# Patient Record
Sex: Female | Born: 1985
Health system: Southern US, Community
[De-identification: ages and names within clinical notes are randomized; demographics above are authoritative.]

## PROBLEM LIST (undated history)

## (undated) DIAGNOSIS — N809 Endometriosis, unspecified: Secondary | ICD-10-CM

## (undated) DIAGNOSIS — E079 Disorder of thyroid, unspecified: Secondary | ICD-10-CM

## (undated) HISTORY — PX: TONSILLECTOMY: SUR1361

## (undated) SURGERY — LAPAROSCOPY, DIAGNOSTIC
Anesthesia: General

---
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
## 2006-01-24 ENCOUNTER — Ambulatory Visit: Payer: Self-pay | Admitting: Nurse Practitioner

## 2006-07-30 ENCOUNTER — Emergency Department: Payer: Self-pay | Admitting: Emergency Medicine

## 2007-12-18 ENCOUNTER — Ambulatory Visit: Payer: Self-pay | Admitting: Orthopedic Surgery

## 2007-12-25 ENCOUNTER — Ambulatory Visit: Payer: Self-pay | Admitting: Orthopedic Surgery

## 2008-12-11 ENCOUNTER — Ambulatory Visit: Payer: Self-pay | Admitting: Family Medicine

## 2008-12-12 ENCOUNTER — Ambulatory Visit: Payer: Self-pay | Admitting: Family Medicine

## 2011-01-28 ENCOUNTER — Ambulatory Visit: Payer: Self-pay | Admitting: Family Medicine

## 2011-02-22 ENCOUNTER — Ambulatory Visit: Payer: Self-pay | Admitting: Obstetrics and Gynecology

## 2011-02-28 ENCOUNTER — Ambulatory Visit: Payer: Self-pay | Admitting: Obstetrics and Gynecology

## 2011-03-03 LAB — PATHOLOGY REPORT

## 2013-01-04 ENCOUNTER — Inpatient Hospital Stay: Payer: Self-pay

## 2013-01-04 LAB — CBC WITH DIFFERENTIAL/PLATELET
Basophil %: 0.2 %
Eosinophil #: 0 10*3/uL (ref 0.0–0.7)
HCT: 41 % (ref 35.0–47.0)
Lymphocyte %: 6.5 %
MCH: 31.4 pg (ref 26.0–34.0)
MCV: 89 fL (ref 80–100)
Monocyte %: 2.7 %
Neutrophil #: 18.3 10*3/uL — ABNORMAL HIGH (ref 1.4–6.5)
RDW: 13.5 % (ref 11.5–14.5)
WBC: 20.2 10*3/uL — ABNORMAL HIGH (ref 3.6–11.0)

## 2013-01-05 LAB — HEMATOCRIT: HCT: 33.1 % — ABNORMAL LOW

## 2014-08-05 NOTE — H&P (Signed)
L&D Evaluation:  History:  HPI 29 y/o G1 @ 40/5wks EDC 12/28/12 sent from the office with c/o regular contractions beginning this am, small bloody show, denies leaking fluid baby is active. [facility] pregnancy GBS negative   Presents with contractions   Patient's Medical History No Chronic Illness   Patient's Surgical History none   Medications Pre Natal Vitamins   Allergies NKDA   Social History none   Family History Non-Contributory   ROS:  ROS All systems were reviewed.  HEENT, CNS, GI, GU, Respiratory, CV, Renal and Musculoskeletal systems were found to be normal.   Exam:  Vital Signs stable   Urine Protein to lab   General no apparent distress   Mental Status clear   Chest clear   Heart normal sinus rhythm   Abdomen gravid, non-tender   Estimated Fetal Weight Average for gestational age   Fetal Position vtx   Fundal Height term   Back no CVAT   Edema 1+  pedal   Reflexes 2+   Clonus negative   Pelvic no external lesions, 3cm at office current exam 4-5cm 100% vtx @ -1 BBOW small show   Mebranes Intact   FHT normal rate with no decels, 130's 140's avg variability with accels (intermittent EFM)   Fetal Heart Rate 144   Ucx regular, Q 2/3 mins 60 sec moderate   Skin dry   Lymph no lymphadenopathy   Impression:  Impression active labor   Plan:  Plan EFM/NST, monitor contractions and for cervical change   Comments Admitted, explained what to expect first baby. breathing through uc's well, ambulating, rocking in rocker and bouncing on ball. Considering epidural with labor progress.Husband supportive at bedside.   Electronic Signatures: Albertina ParrLugiano, Jayse Hodkinson B (CNM)  (Signed 10-Oct-14 13:44)  Authored: L&D Evaluation   Last Updated: 10-Oct-14 13:44 by Albertina ParrLugiano, Avana Kreiser B (CNM)

## 2015-03-04 ENCOUNTER — Other Ambulatory Visit: Payer: Self-pay | Admitting: Orthopedic Surgery

## 2015-03-04 DIAGNOSIS — M25531 Pain in right wrist: Secondary | ICD-10-CM

## 2015-03-04 DIAGNOSIS — S60211D Contusion of right wrist, subsequent encounter: Secondary | ICD-10-CM

## 2015-03-12 ENCOUNTER — Ambulatory Visit
Admission: RE | Admit: 2015-03-12 | Discharge: 2015-03-12 | Disposition: A | Discharge status: Home / Self Care | Payer: 59 | Source: Ambulatory Visit | Attending: Orthopedic Surgery | Admitting: Orthopedic Surgery

## 2015-03-12 DIAGNOSIS — M25531 Pain in right wrist: Secondary | ICD-10-CM | POA: Insufficient documentation

## 2015-03-12 DIAGNOSIS — S60211D Contusion of right wrist, subsequent encounter: Secondary | ICD-10-CM | POA: Insufficient documentation

## 2015-08-27 ENCOUNTER — Other Ambulatory Visit (HOSPITAL_COMMUNITY): Payer: Self-pay | Admitting: Family Medicine

## 2015-08-27 ENCOUNTER — Ambulatory Visit
Admission: RE | Admit: 2015-08-27 | Discharge: 2015-08-27 | Disposition: A | Discharge status: Home / Self Care | Payer: No Typology Code available for payment source | Source: Ambulatory Visit | Attending: Family Medicine | Admitting: Family Medicine

## 2015-08-27 ENCOUNTER — Ambulatory Visit
Admission: RE | Admit: 2015-08-27 | Discharge: 2015-08-27 | Disposition: A | Discharge status: Home / Self Care | Payer: No Typology Code available for payment source | Source: Ambulatory Visit | Attending: *Deleted | Admitting: *Deleted

## 2015-08-27 DIAGNOSIS — R7611 Nonspecific reaction to tuberculin skin test without active tuberculosis: Secondary | ICD-10-CM

## 2015-10-29 ENCOUNTER — Emergency Department: Payer: Managed Care, Other (non HMO)

## 2015-10-29 ENCOUNTER — Encounter: Payer: Self-pay | Admitting: Emergency Medicine

## 2015-10-29 ENCOUNTER — Emergency Department
Admission: EM | Admit: 2015-10-29 | Discharge: 2015-10-29 | Disposition: A | Discharge status: Home / Self Care | Payer: Managed Care, Other (non HMO) | Attending: Emergency Medicine | Admitting: Emergency Medicine

## 2015-10-29 DIAGNOSIS — R51 Headache: Secondary | ICD-10-CM | POA: Insufficient documentation

## 2015-10-29 DIAGNOSIS — R519 Headache, unspecified: Secondary | ICD-10-CM

## 2015-10-29 DIAGNOSIS — R112 Nausea with vomiting, unspecified: Secondary | ICD-10-CM | POA: Diagnosis present

## 2015-10-29 HISTORY — DX: Endometriosis, unspecified: N80.9

## 2015-10-29 LAB — POCT PREGNANCY, URINE: Preg Test, Ur: NEGATIVE

## 2015-10-29 MED ORDER — SODIUM CHLORIDE 0.9 % IV BOLUS (SEPSIS)
500.0000 mL | INTRAVENOUS | Status: AC
  Administered 2015-10-29: 500 mL via INTRAVENOUS

## 2015-10-29 MED ORDER — METOCLOPRAMIDE HCL 5 MG/ML IJ SOLN
10.0000 mg | INTRAMUSCULAR | Status: AC
  Administered 2015-10-29: 10 mg via INTRAVENOUS
  Filled 2015-10-29: qty 2

## 2015-10-29 MED ORDER — KETOROLAC TROMETHAMINE 30 MG/ML IJ SOLN
15.0000 mg | Freq: Once | INTRAMUSCULAR | Status: AC
  Administered 2015-10-29: 15 mg via INTRAVENOUS
  Filled 2015-10-29: qty 1

## 2015-10-29 MED ORDER — DIPHENHYDRAMINE HCL 50 MG/ML IJ SOLN
25.0000 mg | Freq: Once | INTRAMUSCULAR | Status: AC
  Administered 2015-10-29: 25 mg via INTRAVENOUS
  Filled 2015-10-29: qty 1

## 2015-10-29 MED ORDER — DEXAMETHASONE SODIUM PHOSPHATE 10 MG/ML IJ SOLN
10.0000 mg | Freq: Once | INTRAMUSCULAR | Status: AC
  Administered 2015-10-29: 10 mg via INTRAVENOUS
  Filled 2015-10-29: qty 1

## 2015-10-29 NOTE — Discharge Instructions (Signed)

## 2015-10-29 NOTE — ED Notes (Signed)
Reviewed d/c instructions, follow-up care with pt. Pt verbalized understanding 

## 2015-10-29 NOTE — ED Triage Notes (Signed)
Patient presents to the ED with headache since 1:30pm that has been increasing and is now the worst headache of her life.  Patient is photophobic and is complaining of blurry vision in her left eye.  Patient reports she has been on thyroid medication for about 1 week.  Patient works at [employer] and co-worker states patient is acting somewhat different than she was before.

## 2015-10-29 NOTE — ED Notes (Signed)
Pt aware of need for urine  

## 2015-10-29 NOTE — ED Provider Notes (Signed)
[facility] Emergency Department Provider Note  ____________________________________________   First MD Initiated Contact with Patient 10/29/15 1634     (approximate)  I have reviewed the triage vital signs and the nursing notes.   HISTORY  Chief Complaint Migraine; Blurred Vision; and Emesis    HPI Angela Clark is a 30 y.o. female who presents for evaluation of gradual onset but steadily worsening headache starting about 3 hours ago, now severe.  She states that actually she has felt intermittent headaches primarily on the left side of the last few days but she assumed it was because of her recently starting on levothyroxine.  Today, however, the headache began gradually and and steadily became severe with sharp stabbing pain on the left side of her head and face.  She notes that it is primarily behind her left eye and she feels like her eye is going to explode.  She has had some tearing from her left eye.  She denies any recent upper respiratory symptoms.  She denies neck pain and neck stiffness.  She states that light and sound make the pain worse and nothing particular makes it better.  She denies fever/chills, chest pain, shortness of breath, abdominal pain, dysuria.  She has had several episodes of vomiting and remains nauseated.  She denies numbness, tingling, and weakness in any of her extremities.   Past Medical History:  Diagnosis Date  . Endometriosis     There are no active problems to display for this patient.   Past Surgical History:  Procedure Laterality Date  . TONSILLECTOMY      Prior to Admission medications   Not on File    Allergies Prednisone  History reviewed. No pertinent family history.  Social History Social History  Substance Use Topics  . Smoking status: Never Smoker  . Smokeless tobacco: Never Used  . Alcohol use Not on file    Review of Systems Constitutional: No fever/chills Eyes: Blurry vision, pain in  left eye ENT: No sore throat. Cardiovascular: Denies chest pain. Respiratory: Denies shortness of breath. Gastrointestinal: No abdominal pain.  +N/V.  No diarrhea.  No constipation. Genitourinary: Negative for dysuria. Musculoskeletal: Negative for back pain. Skin: Negative for rash. Neurological: Severe left sided headache in frontal head and face  10-point ROS otherwise negative.  ____________________________________________   PHYSICAL EXAM:  VITAL SIGNS: ED Triage Vitals  Enc Vitals Group     BP 10/29/15 1605 133/78     Pulse Rate 10/29/15 1605 73     Resp 10/29/15 1605 18     Temp 10/29/15 1605 98.1 F (36.7 C)     Temp Source 10/29/15 1605 Oral     SpO2 10/29/15 1605 99 %     Weight 10/29/15 1605 155 lb (70.3 kg)     Height 10/29/15 1605  (1.676 m)     Head Circumference --      Peak Flow --      Pain Score 10/29/15 1606 10     Pain Loc --      Pain Edu? --      Excl. in GC? --     Constitutional: Alert and oriented but appears uncomfortable Eyes: Conjunctivae are normal. PERRL. EOMI.  no papilledema visible on funduscopic exam Head: Atraumatic. Nose: No congestion/rhinnorhea. Mouth/Throat: Mucous membranes are moist.  Oropharynx non-erythematous. Neck: No stridor.  No meningeal signs.  No cervical spine tenderness to palpation. Cardiovascular: Normal rate, regular rhythm. Good peripheral circulation. Grossly normal heart sounds.  Respiratory: Normal respiratory effort.  No retractions. Lungs CTAB. Gastrointestinal: Soft and nontender. No distention.  Musculoskeletal: No lower extremity tenderness nor edema. No gross deformities of extremities. Neurologic:  Normal speech and language. No gross focal neurologic deficits are appreciated.  Skin:  Skin is warm, dry and intact. No rash noted. Psychiatric: Mood and affect are normal. Speech and behavior are normal.  ____________________________________________   LABS (all labs ordered are listed, but only  abnormal results are displayed)  Labs Reviewed  POCT PREGNANCY, URINE  POC URINE PREG, ED   ____________________________________________  EKG  None ____________________________________________  RADIOLOGY I, Zaiah Eckerson, personally viewed and evaluated these images (plain radiographs) as part of my medical decision making, as well as reviewing the written report by the radiologist.   Ct Head Wo Contrast  Result Date: 10/29/2015 CLINICAL DATA:  Acute onset of headache today at 1:30 p.m. EXAM: CT HEAD WITHOUT CONTRAST TECHNIQUE: Contiguous axial images were obtained from the base of the skull through the vertex without intravenous contrast. COMPARISON:  None. FINDINGS: Normal appearing cerebral hemispheres and posterior fossa structures. Normal size and position of the ventricles. No intracranial hemorrhage, mass lesion or CT evidence of acute infarction. Unremarkable bones and included paranasal sinuses. IMPRESSION: Normal examination. Electronically Signed   By: Beckie Salts M.D.   On: 10/29/2015 16:21    ____________________________________________   PROCEDURES  Procedure(s) performed:   Procedures   ____________________________________________   INITIAL IMPRESSION / ASSESSMENT AND PLAN / ED COURSE  Pertinent labs & imaging results that were available during my care of the patient were reviewed by me and considered in my medical decision making (see chart for details).  The patient has no focal neurological deficits and is alert and oriented although she does appear to be in pain.  She has a normal head CT.  She has no evidence of papilledema at this time making pseudotumor cerebri less likely.  Her symptoms are most consistent either with a cluster headache or migraine.  Given the presence of the left-sided facial pain involving her eye and the tearing of the eye, I will first treat her with oxygen on nonrebreather to see if her symptoms improve or resolve.  I will then  proceed with standard migraine treatment.  Clinical Course  Comment By Time  I reassessed the patient after about 20 minutes on oxygen and she states that she feels no different and no better.  I will now proceed with migraine treatment. Loleta Rose, MD 08/03 1657  Patient states her headache is gone and she would like to go home.  I gave my usual and customary return precautions. Loleta Rose, MD 08/03 1939    ____________________________________________  FINAL CLINICAL IMPRESSION(S) / ED DIAGNOSES  Final diagnoses:  Acute nonintractable headache, unspecified headache type     MEDICATIONS GIVEN DURING THIS VISIT:  Medications  ketorolac (TORADOL) 30 MG/ML injection 15 mg (15 mg Intravenous Given 10/29/15 1705)  diphenhydrAMINE (BENADRYL) injection 25 mg (25 mg Intravenous Given 10/29/15 1705)  dexamethasone (DECADRON) injection 10 mg (10 mg Intravenous Given 10/29/15 1705)  metoCLOPramide (REGLAN) injection 10 mg (10 mg Intravenous Given 10/29/15 1705)  sodium chloride 0.9 % bolus 500 mL (0 mLs Intravenous Stopped 10/29/15 1800)     NEW OUTPATIENT MEDICATIONS STARTED DURING THIS VISIT:  New Prescriptions   No medications on file      Note:  This document was prepared using Dragon voice recognition software and may include unintentional dictation errors.    Loleta Rose, MD  10/29/15 1944  

## 2015-10-29 NOTE — ED Notes (Signed)
Patient transported to CT, escorted vis wheelchair by Sheralyn Boatman [facility])

## 2016-08-01 ENCOUNTER — Encounter: Payer: Self-pay | Admitting: Emergency Medicine

## 2016-08-01 DIAGNOSIS — R1012 Left upper quadrant pain: Secondary | ICD-10-CM | POA: Diagnosis present

## 2016-08-01 DIAGNOSIS — N809 Endometriosis, unspecified: Secondary | ICD-10-CM | POA: Insufficient documentation

## 2016-08-01 LAB — URINALYSIS, COMPLETE (UACMP) WITH MICROSCOPIC
BACTERIA UA: NONE SEEN
BILIRUBIN URINE: NEGATIVE
GLUCOSE, UA: NEGATIVE mg/dL
HGB URINE DIPSTICK: NEGATIVE
Ketones, ur: NEGATIVE mg/dL
LEUKOCYTES UA: NEGATIVE
NITRITE: NEGATIVE
PH: 8 (ref 5.0–8.0)
Protein, ur: NEGATIVE mg/dL
RBC / HPF: NONE SEEN RBC/hpf (ref 0–5)
SPECIFIC GRAVITY, URINE: 1.009 (ref 1.005–1.030)
Squamous Epithelial / LPF: NONE SEEN

## 2016-08-01 LAB — COMPREHENSIVE METABOLIC PANEL
ALBUMIN: 4.4 g/dL (ref 3.5–5.0)
ALT: 18 U/L (ref 14–54)
ANION GAP: 8 (ref 5–15)
AST: 21 U/L (ref 15–41)
Alkaline Phosphatase: 104 U/L (ref 38–126)
BILIRUBIN TOTAL: 0.9 mg/dL (ref 0.3–1.2)
BUN: 10 mg/dL (ref 6–20)
CALCIUM: 9.2 mg/dL (ref 8.9–10.3)
CO2: 23 mmol/L (ref 22–32)
Chloride: 109 mmol/L (ref 101–111)
Creatinine, Ser: 0.91 mg/dL (ref 0.44–1.00)
GFR calc non Af Amer: >60 mL/min (ref >60)
GLUCOSE: 122 mg/dL — AB (ref 65–99)
POTASSIUM: 3.6 mmol/L (ref 3.5–5.1)
SODIUM: 140 mmol/L (ref 135–145)
TOTAL PROTEIN: 7.4 g/dL (ref 6.5–8.1)

## 2016-08-01 LAB — CBC
HEMATOCRIT: 42.3 % (ref 35.0–47.0)
Hemoglobin: 14.7 g/dL (ref 12.0–16.0)
MCH: 31.1 pg (ref 26.0–34.0)
MCHC: 34.9 g/dL (ref 32.0–36.0)
MCV: 89.1 fL (ref 80.0–100.0)
Platelets: 318 10*3/uL (ref 150–440)
RBC: 4.75 MIL/uL (ref 3.80–5.20)
RDW: 13.3 % (ref 11.5–14.5)
WBC: 10.9 10*3/uL (ref 3.6–11.0)

## 2016-08-01 LAB — POCT PREGNANCY, URINE: Preg Test, Ur: NEGATIVE

## 2016-08-01 LAB — LIPASE, BLOOD: Lipase: 34 U/L (ref 11–51)

## 2016-08-01 NOTE — ED Triage Notes (Signed)
Pt in with co left upper quad pain, had ultrasound done but no obvious abnormality other then left ovary was found to be higher then normal.  Has appointment with [facility] Wednesday but was told to come to ED for worsening symptoms.

## 2016-08-02 ENCOUNTER — Encounter: Payer: Self-pay | Admitting: Emergency Medicine

## 2016-08-02 ENCOUNTER — Emergency Department
Admission: EM | Admit: 2016-08-02 | Discharge: 2016-08-02 | Disposition: A | Discharge status: Home / Self Care | Payer: 59 | Attending: Emergency Medicine | Admitting: Emergency Medicine

## 2016-08-02 ENCOUNTER — Emergency Department: Payer: 59

## 2016-08-02 DIAGNOSIS — R109 Unspecified abdominal pain: Secondary | ICD-10-CM

## 2016-08-02 DIAGNOSIS — N809 Endometriosis, unspecified: Secondary | ICD-10-CM

## 2016-08-02 MED ORDER — ONDANSETRON HCL 4 MG/2ML IJ SOLN
4.0000 mg | Freq: Once | INTRAMUSCULAR | Status: AC
  Administered 2016-08-02: 4 mg via INTRAVENOUS
  Filled 2016-08-02: qty 2

## 2016-08-02 MED ORDER — OXYCODONE-ACETAMINOPHEN 5-325 MG PO TABS: 1.0000 | ORAL_TABLET | Freq: Four times a day (QID) | ORAL | 0 refills | Status: DC | PRN

## 2016-08-02 MED ORDER — MORPHINE SULFATE (PF) 2 MG/ML IV SOLN
2.0000 mg | Freq: Once | INTRAVENOUS | Status: AC
  Administered 2016-08-02: 2 mg via INTRAVENOUS
  Filled 2016-08-02: qty 1

## 2016-08-02 NOTE — ED Provider Notes (Signed)
[facility] Emergency Department Provider Note    First MD Initiated Contact with Patient 08/02/16 0132     (approximate)  I have reviewed the triage vital signs and the nursing notes.   HISTORY  Chief Complaint Abdominal Pain   HPI Angela Clark is a 31 y.o. female with history of endometriosis presents with three-day history of left upper quadrant/left lower quadrant abdominal pain that is currently 9 out of 10. Patient describes pain as being sharp. Patient states that she was ultrasound by Dr. [facility] who informed her that her left ovary was located higher than normal. Patient states pain acutely worsened tonight which prompted her visit to the emergency department. Patient denies any urinary symptoms no fever no diarrhea constipation. Patient does admit to one episode of vomiting tonight. She denies any aggravating or alleviating factors.   Past Medical History:  Diagnosis Date  . Endometriosis     There are no active problems to display for this patient.   Past Surgical History:  Procedure Laterality Date  . TONSILLECTOMY      Prior to Admission medications   Not on File    Allergies Prednisone  History reviewed. No pertinent family history.  Social History Social History  Substance Use Topics  . Smoking status: Never Smoker  . Smokeless tobacco: Never Used  . Alcohol use Yes     Comment: Occasionally    Review of Systems Constitutional: No fever/chills Eyes: No visual changes. ENT: No sore throat. Cardiovascular: Denies chest pain. Respiratory: Denies shortness of breath. Gastrointestinal: Positive for abdominal pain and vomiting Genitourinary: Negative for dysuria. Musculoskeletal: Negative for back pain. Integumentary: Negative for rash. Neurological: Negative for headaches, focal weakness or numbness.  ____________________________________________   PHYSICAL EXAM:  VITAL SIGNS: ED Triage Vitals [08/01/16  2146]  Enc Vitals Group     BP 138/75     Pulse Rate 76     Resp 18     Temp 98.4 F (36.9 C)     Temp Source Oral     SpO2 98 %     Weight 186 lb (84.4 kg)     Height 5\' 6"  (1.676 m)     Head Circumference      Peak Flow      Pain Score 9     Pain Loc      Pain Edu?      Excl. in GC?     Constitutional: Alert and oriented. Apparent discomfort  Eyes: Conjunctivae are normal. PERRL. EOMI. Head: Atraumatic. Mouth/Throat: Mucous membranes are moist.  Oropharynx non-erythematous. Neck: No stridor.  Cardiovascular: Normal rate, regular rhythm. Good peripheral circulation. Grossly normal heart sounds. Respiratory: Normal respiratory effort.  No retractions. Lungs CTAB. Gastrointestinal: Left upper and lower quadrant pain with palpation. No distention.  Musculoskeletal: No lower extremity tenderness nor edema. No gross deformities of extremities. Neurologic:  Normal speech and language. No gross focal neurologic deficits are appreciated.  Skin:  Skin is warm, dry and intact. No rash noted. Psychiatric: Mood and affect are normal. Speech and behavior are normal.  ____________________________________________   LABS (all labs ordered are listed, but only abnormal results are displayed)  Labs Reviewed  COMPREHENSIVE METABOLIC PANEL - Abnormal; Notable for the following:       Result Value   Glucose, Bld 122 (*)    All other components within normal limits  URINALYSIS, COMPLETE (UACMP) WITH MICROSCOPIC - Abnormal; Notable for the following:    Color, Urine YELLOW (*)  APPearance CLOUDY (*)    All other components within normal limits  CBC  LIPASE, BLOOD  POC URINE PREG, ED  POCT PREGNANCY, URINE   ___________________________ RADIOLOGY I, [place] N Jauna Raczynski, personally viewed and evaluated these images (plain radiographs) as part of my medical decision making, as well as reviewing the written report by the radiologist.  Ct Renal Stone Study  Result Date: 08/02/2016 CLINICAL  DATA:  Acute onset of left upper quadrant abdominal pain and vomiting. Initial encounter. EXAM: CT ABDOMEN AND PELVIS WITHOUT CONTRAST TECHNIQUE: Multidetector CT imaging of the abdomen and pelvis was performed following the standard protocol without IV contrast. COMPARISON:  CT of the abdomen and pelvis, and pelvic ultrasound, performed 01/28/2011 FINDINGS: Lower chest: The visualized lung bases are grossly clear. The visualized portions of the mediastinum are unremarkable. Hepatobiliary: The liver is unremarkable in appearance. The gallbladder is unremarkable in appearance. The common bile duct remains normal in caliber. Pancreas: The pancreas is within normal limits. Spleen: The spleen is unremarkable in appearance. Adrenals/Urinary Tract: The adrenal glands are unremarkable in appearance. The kidneys are within normal limits. There is no evidence of hydronephrosis. No renal or ureteral stones are identified. No perinephric stranding is seen. Stomach/Bowel: The stomach is unremarkable in appearance. The small bowel is within normal limits. The appendix is normal in caliber, without evidence of appendicitis. The colon is unremarkable in appearance. Vascular/Lymphatic: The abdominal aorta is unremarkable in appearance. The inferior vena cava is grossly unremarkable. No retroperitoneal lymphadenopathy is seen. No pelvic sidewall lymphadenopathy is identified. Reproductive: The bladder is mildly distended and within normal limits. The uterus is grossly unremarkable in appearance. The ovaries are relatively symmetric. No suspicious adnexal masses are seen. Other: No additional soft tissue abnormalities are seen. Musculoskeletal: No acute osseous abnormalities are identified. The visualized musculature is unremarkable in appearance. IMPRESSION: Unremarkable noncontrast CT of the abdomen and pelvis. Electronically Signed   By: Roanna Raider M.D.   On: 08/02/2016 02:35      Procedures   ____________________________________________   INITIAL IMPRESSION / ASSESSMENT AND PLAN / ED COURSE  Pertinent labs & imaging results that were available during my care of the patient were reviewed by me and considered in my medical decision making (see chart for details).  Patient given IV morphine and Zofran with improvement of pain current pain score is 4 out of 10. CT scan revealed no acute abnormality in the patient's abdomen and pelvis.      ____________________________________________  FINAL CLINICAL IMPRESSION(S) / ED DIAGNOSES  Final diagnoses:  Abdominal pain, unspecified abdominal location     MEDICATIONS GIVEN DURING THIS VISIT:  Medications  morphine 2 MG/ML injection 2 mg (2 mg Intravenous Given 08/02/16 0200)  ondansetron (ZOFRAN) injection 4 mg (4 mg Intravenous Given 08/02/16 0200)     NEW OUTPATIENT MEDICATIONS STARTED DURING THIS VISIT:  New Prescriptions   No medications on file    Modified Medications   No medications on file    Discontinued Medications   No medications on file     Note:  This document was prepared using Dragon voice recognition software and may include unintentional dictation errors.    Darci Current, MD 08/02/16 [phone]

## 2016-08-07 ENCOUNTER — Observation Stay: Payer: 59 | Admitting: Anesthesiology

## 2016-08-07 ENCOUNTER — Observation Stay
Admission: EM | Admit: 2016-08-07 | Discharge: 2016-08-08 | Disposition: A | Discharge status: Home / Self Care | Payer: 59 | Attending: Obstetrics & Gynecology | Admitting: Obstetrics & Gynecology

## 2016-08-07 ENCOUNTER — Emergency Department: Payer: 59

## 2016-08-07 ENCOUNTER — Encounter
Admission: EM | Disposition: A | Discharge status: Home / Self Care | Payer: Self-pay | Source: Home / Self Care | Attending: Student in an Organized Health Care Education/Training Program

## 2016-08-07 ENCOUNTER — Encounter: Payer: Self-pay | Admitting: Emergency Medicine

## 2016-08-07 DIAGNOSIS — R1032 Left lower quadrant pain: Secondary | ICD-10-CM

## 2016-08-07 DIAGNOSIS — N83202 Unspecified ovarian cyst, left side: Secondary | ICD-10-CM | POA: Diagnosis not present

## 2016-08-07 DIAGNOSIS — N809 Endometriosis, unspecified: Secondary | ICD-10-CM | POA: Diagnosis not present

## 2016-08-07 DIAGNOSIS — Z833 Family history of diabetes mellitus: Secondary | ICD-10-CM | POA: Insufficient documentation

## 2016-08-07 DIAGNOSIS — Z800 Family history of malignant neoplasm of digestive organs: Secondary | ICD-10-CM | POA: Insufficient documentation

## 2016-08-07 DIAGNOSIS — Z841 Family history of disorders of kidney and ureter: Secondary | ICD-10-CM | POA: Diagnosis not present

## 2016-08-07 DIAGNOSIS — R102 Pelvic and perineal pain: Secondary | ICD-10-CM | POA: Diagnosis present

## 2016-08-07 DIAGNOSIS — G8929 Other chronic pain: Secondary | ICD-10-CM | POA: Diagnosis not present

## 2016-08-07 DIAGNOSIS — N801 Endometriosis of ovary: Principal | ICD-10-CM | POA: Insufficient documentation

## 2016-08-07 DIAGNOSIS — Z8249 Family history of ischemic heart disease and other diseases of the circulatory system: Secondary | ICD-10-CM | POA: Insufficient documentation

## 2016-08-07 HISTORY — PX: LAPAROSCOPIC BILATERAL SALPINGO OOPHERECTOMY: SHX5890

## 2016-08-07 HISTORY — PX: LAPAROSCOPY: SHX197

## 2016-08-07 LAB — COMPREHENSIVE METABOLIC PANEL
ALK PHOS: 91 U/L (ref 38–126)
ALT: 20 U/L (ref 14–54)
AST: 22 U/L (ref 15–41)
Albumin: 4.5 g/dL (ref 3.5–5.0)
Anion gap: 6 (ref 5–15)
BUN: 14 mg/dL (ref 6–20)
CALCIUM: 9.4 mg/dL (ref 8.9–10.3)
CO2: 23 mmol/L (ref 22–32)
CREATININE: 0.94 mg/dL (ref 0.44–1.00)
Chloride: 111 mmol/L (ref 101–111)
Glucose, Bld: 103 mg/dL — ABNORMAL HIGH (ref 65–99)
Potassium: 3.8 mmol/L (ref 3.5–5.1)
Sodium: 140 mmol/L (ref 135–145)
Total Bilirubin: 1.1 mg/dL (ref 0.3–1.2)
Total Protein: 7.4 g/dL (ref 6.5–8.1)

## 2016-08-07 LAB — CBC
HCT: 44.4 % (ref 35.0–47.0)
Hemoglobin: 15.6 g/dL (ref 12.0–16.0)
MCH: 31 pg (ref 26.0–34.0)
MCHC: 35.1 g/dL (ref 32.0–36.0)
MCV: 88.2 fL (ref 80.0–100.0)
PLATELETS: 305 10*3/uL (ref 150–440)
RBC: 5.04 MIL/uL (ref 3.80–5.20)
RDW: 13.2 % (ref 11.5–14.5)
WBC: 9.8 10*3/uL (ref 3.6–11.0)

## 2016-08-07 LAB — TYPE AND SCREEN
ABO/RH(D): B POS
ANTIBODY SCREEN: NEGATIVE

## 2016-08-07 LAB — POCT PREGNANCY, URINE: Preg Test, Ur: NEGATIVE

## 2016-08-07 SURGERY — LAPAROSCOPY, DIAGNOSTIC
Anesthesia: General | Site: Abdomen | Wound class: Clean

## 2016-08-07 MED ORDER — BUPIVACAINE HCL 0.5 % IJ SOLN
INTRAMUSCULAR | Status: DC | PRN
  Administered 2016-08-07: 30 mL

## 2016-08-07 MED ORDER — KETOROLAC TROMETHAMINE 30 MG/ML IJ SOLN
INTRAMUSCULAR | Status: AC
  Filled 2016-08-07: qty 1

## 2016-08-07 MED ORDER — FENTANYL CITRATE (PF) 100 MCG/2ML IJ SOLN
INTRAMUSCULAR | Status: DC | PRN
  Administered 2016-08-07: 100 ug via INTRAVENOUS
  Administered 2016-08-07 – 2016-08-08 (×2): 50 ug via INTRAVENOUS

## 2016-08-07 MED ORDER — ONDANSETRON HCL 4 MG/2ML IJ SOLN
INTRAMUSCULAR | Status: AC
  Filled 2016-08-07: qty 2

## 2016-08-07 MED ORDER — LACTATED RINGERS IV SOLN
INTRAVENOUS | Status: DC | PRN
  Administered 2016-08-07: 22:00:00 via INTRAVENOUS

## 2016-08-07 MED ORDER — MIDAZOLAM HCL 2 MG/2ML IJ SOLN
INTRAMUSCULAR | Status: AC
  Filled 2016-08-07: qty 2

## 2016-08-07 MED ORDER — MIDAZOLAM HCL 2 MG/2ML IJ SOLN
INTRAMUSCULAR | Status: DC | PRN
  Administered 2016-08-07: 2 mg via INTRAVENOUS

## 2016-08-07 MED ORDER — PROPOFOL 10 MG/ML IV BOLUS
INTRAVENOUS | Status: AC
  Filled 2016-08-07: qty 20

## 2016-08-07 MED ORDER — FENTANYL CITRATE (PF) 100 MCG/2ML IJ SOLN
INTRAMUSCULAR | Status: AC
  Filled 2016-08-07: qty 2

## 2016-08-07 MED ORDER — SUCCINYLCHOLINE CHLORIDE 20 MG/ML IJ SOLN
INTRAMUSCULAR | Status: DC | PRN
  Administered 2016-08-07: 100 mg via INTRAVENOUS

## 2016-08-07 MED ORDER — SUGAMMADEX SODIUM 200 MG/2ML IV SOLN
INTRAVENOUS | Status: AC
  Filled 2016-08-07: qty 2

## 2016-08-07 MED ORDER — MORPHINE SULFATE (PF) 4 MG/ML IV SOLN
4.0000 mg | INTRAVENOUS | Status: DC | PRN
  Administered 2016-08-07 (×2): 4 mg via INTRAVENOUS
  Filled 2016-08-07 (×2): qty 1

## 2016-08-07 MED ORDER — SODIUM CHLORIDE 0.9 % IV BOLUS (SEPSIS)
1000.0000 mL | Freq: Once | INTRAVENOUS | Status: AC
  Administered 2016-08-07: 1000 mL via INTRAVENOUS

## 2016-08-07 MED ORDER — DEXAMETHASONE SODIUM PHOSPHATE 10 MG/ML IJ SOLN
INTRAMUSCULAR | Status: AC
  Filled 2016-08-07: qty 1

## 2016-08-07 MED ORDER — KETOROLAC TROMETHAMINE 30 MG/ML IJ SOLN
INTRAMUSCULAR | Status: DC | PRN
  Administered 2016-08-07: 30 mg via INTRAVENOUS

## 2016-08-07 MED ORDER — METHYLENE BLUE 0.5 % INJ SOLN
INTRAVENOUS | Status: AC
  Filled 2016-08-07: qty 10

## 2016-08-07 MED ORDER — ONDANSETRON HCL 4 MG/2ML IJ SOLN
INTRAMUSCULAR | Status: DC | PRN
  Administered 2016-08-07: 4 mg via INTRAVENOUS

## 2016-08-07 MED ORDER — PROMETHAZINE HCL 25 MG/ML IJ SOLN
12.5000 mg | Freq: Once | INTRAMUSCULAR | Status: AC
  Administered 2016-08-07: 12.5 mg via INTRAVENOUS
  Filled 2016-08-07: qty 1

## 2016-08-07 MED ORDER — ROCURONIUM BROMIDE 100 MG/10ML IV SOLN
INTRAVENOUS | Status: DC | PRN
  Administered 2016-08-07: 30 mg via INTRAVENOUS
  Administered 2016-08-07 (×2): 5 mg via INTRAVENOUS
  Administered 2016-08-07: 10 mg via INTRAVENOUS

## 2016-08-07 MED ORDER — PROPOFOL 10 MG/ML IV BOLUS
INTRAVENOUS | Status: DC | PRN
  Administered 2016-08-07: 150 mg via INTRAVENOUS

## 2016-08-07 MED ORDER — DEXAMETHASONE SODIUM PHOSPHATE 10 MG/ML IJ SOLN
INTRAMUSCULAR | Status: DC | PRN
  Administered 2016-08-07: 4 mg via INTRAVENOUS

## 2016-08-07 SURGICAL SUPPLY — 53 items
BAG URO DRAIN 2000ML W/SPOUT (MISCELLANEOUS) ×3 IMPLANT
BARRIER ADHS 3X4 INTERCEED (GAUZE/BANDAGES/DRESSINGS) ×3 IMPLANT
BLADE SURG SZ11 CARB STEEL (BLADE) ×3 IMPLANT
CANISTER SUCT 1200ML W/VALVE (MISCELLANEOUS) ×3 IMPLANT
CATH FOLEY 2WAY  5CC 16FR (CATHETERS) ×1
CATH ROBINSON RED A/P 16FR (CATHETERS) ×3 IMPLANT
CATH URTH 16FR FL 2W BLN LF (CATHETERS) ×2 IMPLANT
CHLORAPREP W/TINT 26ML (MISCELLANEOUS) ×3 IMPLANT
DERMABOND ADVANCED (GAUZE/BANDAGES/DRESSINGS) ×2
DERMABOND ADVANCED .7 DNX12 (GAUZE/BANDAGES/DRESSINGS) ×4 IMPLANT
DRAPE LEGGINS SURG 28X43 STRL (DRAPES) ×3 IMPLANT
DRAPE UNDER BUTTOCK W/FLU (DRAPES) ×3 IMPLANT
DRSG TEGADERM 2-3/8X2-3/4 SM (GAUZE/BANDAGES/DRESSINGS) ×6 IMPLANT
DRSG TELFA 4X3 1S NADH ST (GAUZE/BANDAGES/DRESSINGS) ×9 IMPLANT
ENDOPOUCH RETRIEVER 10 (MISCELLANEOUS) ×3 IMPLANT
GAUZE SPONGE NON-WVN 2X2 STRL (MISCELLANEOUS) ×4 IMPLANT
GLOVE PI ORTHOPRO 6.5 (GLOVE) ×1
GLOVE PI ORTHOPRO STRL 6.5 (GLOVE) ×2 IMPLANT
GLOVE SURG SYN 6.5 ES PF (GLOVE) ×3 IMPLANT
GOWN STRL REUS W/ TWL LRG LVL3 (GOWN DISPOSABLE) ×4 IMPLANT
GOWN STRL REUS W/ TWL XL LVL3 (GOWN DISPOSABLE) ×2 IMPLANT
GOWN STRL REUS W/TWL LRG LVL3 (GOWN DISPOSABLE) ×2
GOWN STRL REUS W/TWL XL LVL3 (GOWN DISPOSABLE) ×1
GRASPER SUT TROCAR 14GX15 (MISCELLANEOUS) ×3 IMPLANT
IRRIGATION STRYKERFLOW (MISCELLANEOUS) IMPLANT
IRRIGATOR STRYKERFLOW (MISCELLANEOUS)
IV NS 1000ML (IV SOLUTION) ×1
IV NS 1000ML BAXH (IV SOLUTION) ×2 IMPLANT
KIT PINK PAD W/HEAD ARE REST (MISCELLANEOUS) ×3
KIT PINK PAD W/HEAD ARM REST (MISCELLANEOUS) ×2 IMPLANT
KIT RM TURNOVER CYSTO AR (KITS) ×3 IMPLANT
L-HOOK LAP DISP 36CM (ELECTROSURGICAL) ×3
LABEL OR SOLS (LABEL) ×3 IMPLANT
LHOOK LAP DISP 36CM (ELECTROSURGICAL) ×2 IMPLANT
LIGASURE LAP MARYLAND 5MM 37CM (ELECTROSURGICAL) ×3 IMPLANT
LIGASURE MARYLAND LAP STAND (ELECTROSURGICAL) ×3 IMPLANT
MANIPULATOR UTERINE 4.5 ZUMI (MISCELLANEOUS) ×6 IMPLANT
NEEDLE VERESS 14GA 120MM (NEEDLE) ×3 IMPLANT
NS IRRIG 500ML POUR BTL (IV SOLUTION) ×3 IMPLANT
PACK LAP CHOLECYSTECTOMY (MISCELLANEOUS) ×3 IMPLANT
PAD OB MATERNITY 4.3X12.25 (PERSONAL CARE ITEMS) ×3 IMPLANT
PAD PREP 24X41 OB/GYN DISP (PERSONAL CARE ITEMS) ×3 IMPLANT
PENCIL ELECTRO HAND CTR (MISCELLANEOUS) ×3 IMPLANT
SCISSORS METZENBAUM CVD 33 (INSTRUMENTS) ×3 IMPLANT
SLEEVE ENDOPATH XCEL 5M (ENDOMECHANICALS) ×9 IMPLANT
SPONGE VERSALON 2X2 STRL (MISCELLANEOUS) ×2
SUT MNCRL AB 4-0 PS2 18 (SUTURE) ×3 IMPLANT
SUT VIC AB 2-0 UR6 27 (SUTURE) ×3 IMPLANT
SUT VIC AB 4-0 PS2 18 (SUTURE) ×3 IMPLANT
SYRINGE 10CC LL (SYRINGE) ×3 IMPLANT
TROCAR ENDO BLADELESS 11MM (ENDOMECHANICALS) ×3 IMPLANT
TROCAR XCEL NON-BLD 5MMX100MML (ENDOMECHANICALS) ×3 IMPLANT
TUBING INSUFFLATOR HI FLOW (MISCELLANEOUS) ×3 IMPLANT

## 2016-08-07 NOTE — ED Provider Notes (Addendum)
[facility] Emergency Department Provider Note    First MD Initiated Contact with Patient 08/07/16 1420     (approximate)  I have reviewed the triage vital signs and the nursing notes.   HISTORY  Chief Complaint Abdominal Pain    HPI Angela Clark is a 31 y.o. female with a history of endometriosis presents with persistent left lower quadrant pain that started last week. States the pain does radiate through to her back. Has been extensively evaluated once previously here in the ER as well as at her outpatient OB/GYN office. No evidence of kidney stone. No fevers. No upper abdominal pain.  States this feels similar to her previous episodes of endometriosis.  Patient called the OB/GYN on-call due to worsening pain who directed her to come to the ER for additional evaluation.   Past Medical History:  Diagnosis Date  . Endometriosis    History reviewed. No pertinent family history. Past Surgical History:  Procedure Laterality Date  . TONSILLECTOMY     There are no active problems to display for this patient.     Prior to Admission medications   Medication Sig Start Date End Date Taking? Authorizing Provider  oxyCODONE-acetaminophen (ROXICET) 5-325 MG tablet Take 1 tablet by mouth every 6 (six) hours as needed for severe pain. 08/02/16   Darci CurrentBrown, [place] N, MD    Allergies Prednisone    Social History Social History  Substance Use Topics  . Smoking status: Never Smoker  . Smokeless tobacco: Never Used  . Alcohol use Yes     Comment: Occasionally    Review of Systems Patient denies headaches, rhinorrhea, blurry vision, numbness, shortness of breath, chest pain, edema, cough, abdominal pain, nausea, vomiting, diarrhea, dysuria, fevers, rashes or hallucinations unless otherwise stated above in HPI. ____________________________________________   PHYSICAL EXAM:  VITAL SIGNS: Vitals:   08/07/16 1407  BP: 113/71  Pulse: 85  Temp: 98.6 F  (37 C)    Constitutional: Alert and oriented. Well appearing and in no acute distress. Eyes: Conjunctivae are normal. PERRL. EOMI. Head: Atraumatic. Nose: No congestion/rhinnorhea. Mouth/Throat: Mucous membranes are moist.  Oropharynx non-erythematous. Neck: No stridor. Painless ROM. No cervical spine tenderness to palpation Hematological/Lymphatic/Immunilogical: No cervical lymphadenopathy. Cardiovascular: Normal rate, regular rhythm. Grossly normal heart sounds.  Good peripheral circulation. Respiratory: Normal respiratory effort.  No retractions. Lungs CTAB. Gastrointestinal: Soft with mild ttp of llq. No distention. No abdominal bruits. No CVA tenderness. Musculoskeletal: No lower extremity tenderness nor edema.  No joint effusions. Neurologic:  Normal speech and language. No gross focal neurologic deficits are appreciated. No gait instability. Skin:  Skin is warm, dry and intact. No rash noted. Psychiatric: Mood and affect are normal. Speech and behavior are normal.  ____________________________________________   LABS (all labs ordered are listed, but only abnormal results are displayed)  Results for orders placed or performed during the hospital encounter of 08/07/16 (from the past 24 hour(s))  Comprehensive metabolic panel     Status: Abnormal   Collection Time: 08/07/16  2:06 PM  Result Value Ref Range   Sodium 140 135 - 145 mmol/L   Potassium 3.8 3.5 - 5.1 mmol/L   Chloride 111 101 - 111 mmol/L   CO2 23 22 - 32 mmol/L   Glucose, Bld 103 (H) 65 - 99 mg/dL   BUN 14 6 - 20 mg/dL   Creatinine, Ser 8.290.94 0.44 - 1.00 mg/dL   Calcium 9.4 8.9 - 56.210.3 mg/dL   Total Protein 7.4 6.5 - 8.1  g/dL   Albumin 4.5 3.5 - 5.0 g/dL   AST 22 15 - 41 U/L   ALT 20 14 - 54 U/L   Alkaline Phosphatase 91 38 - 126 U/L   Total Bilirubin 1.1 0.3 - 1.2 mg/dL   GFR calc non Af Amer >60 >60 mL/min   GFR calc Af Amer >60 >60 mL/min   Anion gap 6 5 - 15  CBC     Status: None   Collection Time:  08/07/16  2:06 PM  Result Value Ref Range   WBC 9.8 3.6 - 11.0 K/uL   RBC 5.04 3.80 - 5.20 MIL/uL   Hemoglobin 15.6 12.0 - 16.0 g/dL   HCT 40.9 81.1 - 91.4 %   MCV 88.2 80.0 - 100.0 fL   MCH 31.0 26.0 - 34.0 pg   MCHC 35.1 32.0 - 36.0 g/dL   RDW 78.2 95.6 - 21.3 %   Platelets 305 150 - 440 K/uL  Type and screen [facility]     Status: None (Preliminary result)   Collection Time: 08/07/16  3:09 PM  Result Value Ref Range   ABO/RH(D) PENDING    Antibody Screen PENDING    Sample Expiration 08/10/2016   Pregnancy, urine POC     Status: None   Collection Time: 08/07/16  3:26 PM  Result Value Ref Range   Preg Test, Ur NEGATIVE NEGATIVE   ____________________________________________ ____________________________________________  RADIOLOGY  I personally reviewed all radiographic images ordered to evaluate for the above acute complaints and reviewed radiology reports and findings.  These findings were personally discussed with the patient.  Please see medical record for radiology report.  ____________________________________________   PROCEDURES  Procedure(s) performed:  Procedures    Critical Care performed: no ____________________________________________   INITIAL IMPRESSION / ASSESSMENT AND PLAN / ED COURSE  Pertinent labs & imaging results that were available during my care of the patient were reviewed by me and considered in my medical decision making (see chart for details).  DDX: torsion, cyst, chronic pelvic pain, endometriosis  Angela Clark is a 31 y.o. who presents to the ED with left lower quadrant pain as described above. Patient afebrile and hemodynamically stable. Nontoxic appearing but does appear uncomfortable. I spoke with Dr. Elesa Massed of OB/GYN regarding her presentation.  We will repeat a formal ultrasound here to ensure there is no evidence of mass or pelvic process. If that is normal Dr. Elesa Massed states that she plans to take the patient for  operative evaluation. We'll keep patient NPO.  The patient will be placed on continuous pulse oximetry and telemetry for monitoring.  Laboratory evaluation will be sent to evaluate for the above complaints.         ____________________________________________   FINAL CLINICAL IMPRESSION(S) / ED DIAGNOSES  Final diagnoses:  LLQ abdominal pain  LLQ pain  Pelvic pain      NEW MEDICATIONS STARTED DURING THIS VISIT:  New Prescriptions   No medications on file     Note:  This document was prepared using Dragon voice recognition software and may include unintentional dictation errors.    Willy Eddy, MD 08/07/16 1441    Willy Eddy, MD 08/07/16 [phone]

## 2016-08-07 NOTE — Anesthesia Preprocedure Evaluation (Signed)
Anesthesia Evaluation  Patient identified by MRN, date of birth, ID band Patient awake    Reviewed: Allergy & Precautions, H&P , NPO status , Patient's Chart, lab work & pertinent test results, reviewed documented beta blocker date and time   History of Anesthesia Complications Negative for: history of anesthetic complications  Airway Mallampati: II  TM Distance: >3 FB Neck ROM: full    Dental  (+) Teeth Intact, Dental Advidsory Given   Pulmonary neg pulmonary ROS,           Cardiovascular Exercise Tolerance: Good negative cardio ROS       Neuro/Psych  Headaches, neg Seizures negative psych ROS   GI/Hepatic negative GI ROS, Neg liver ROS,   Endo/Other  neg diabetesHypothyroidism   Renal/GU negative Renal ROS  negative genitourinary   Musculoskeletal   Abdominal   Peds  Hematology negative hematology ROS (+)   Anesthesia Other Findings Past Medical History: No date: Endometriosis   Reproductive/Obstetrics negative OB ROS                             Anesthesia Physical Anesthesia Plan  ASA: II  Anesthesia Plan: General ETT   Post-op Pain Management:    Induction:   Airway Management Planned:   Additional Equipment:   Intra-op Plan:   Post-operative Plan:   Informed Consent: I have reviewed the patients History and Physical, chart, labs and discussed the procedure including the risks, benefits and alternatives for the proposed anesthesia with the patient or authorized representative who has indicated his/her understanding and acceptance.   Dental Advisory Given  Plan Discussed with: Anesthesiologist, CRNA and Surgeon  Anesthesia Plan Comments:         Anesthesia Quick Evaluation

## 2016-08-07 NOTE — ED Notes (Addendum)
Patient needs to be roomed in 350 post-procedure

## 2016-08-07 NOTE — ED Triage Notes (Signed)
Pt with LLQ pain that started last week. Was seen here Monday and d/c. [facility] Called dr. Reather Laurence.Ward today and told she needed to come to the ER for possible admission.

## 2016-08-07 NOTE — ED Notes (Signed)
Patient taken to PACU by this RN

## 2016-08-07 NOTE — Anesthesia Procedure Notes (Signed)
Procedure Name: Intubation Performed by: Nayda Riesen Pre-anesthesia Checklist: Patient identified, Patient being monitored, Timeout performed, Emergency Drugs available and Suction available Patient Re-evaluated:Patient Re-evaluated prior to inductionOxygen Delivery Method: Circle system utilized Preoxygenation: Pre-oxygenation with 100% oxygen Intubation Type: IV induction Ventilation: Mask ventilation without difficulty Laryngoscope Size: Mac and 3 Grade View: Grade I Tube type: Oral Tube size: 7.0 mm Number of attempts: 1 Airway Equipment and Method: Stylet Placement Confirmation: ETT inserted through vocal cords under direct vision,  positive ETCO2 and breath sounds checked- equal and bilateral Secured at: 21 cm Tube secured with: Tape Dental Injury: Teeth and Oropharynx as per pre-operative assessment        

## 2016-08-07 NOTE — H&P (Signed)
Consult History and Physical   SERVICE: Gynecology   Patient Name: Angela Clark Patient MRN:   [id number]  CC: acutely worsening chronic left-sided pelvic pain  HPI: Angela Clark is a 31 y.o. P1 with    Review of Systems: positives in bold GEN:   fevers, chills, weight changes, appetite changes, fatigue, night sweats HEENT:  HA, vision changes, hearing loss, congestion, rhinorrhea, sinus pressure, dysphagia CV:   CP, palpitations PULM:  SOB, cough GI:  abd pain, N/V/D/C GU:  dysuria, urgency, frequency MSK:  arthralgias, myalgias, back pain, swelling SKIN:  rashes, color changes, pallor NEURO:  numbness, weakness, tingling, seizures, dizziness, tremors PSYCH:  depression, anxiety, behavioral problems, confusion  HEME/LYMPH:  easy bruising or bleeding ENDO:  heat/cold intolerance  Past Obstetrical History: OB History    G2P1011      Past Gynecologic History: Patient's last menstrual period was 07/21/2016. pain began before period and worsened throughout; has not stopped and has become worse.  Past Medical History: Past Medical History:  Diagnosis Date  . Endometriosis     Past Surgical History:   Past Surgical History:  Procedure Laterality Date  . TONSILLECTOMY      Family History:  family history includes Cancer in her father; Cirrhosis in her paternal grandfather; Diabetes in her maternal grandfather and mother; High blood pressure (Hypertension) in her maternal grandfather, maternal grandmother, and mother; Kidney disease in her maternal grandfather; Throat cancer in her father      Social History:  Social History   Social History  . Marital status: Married    Spouse name: N/A  . Number of children: N/A  . Years of education: N/A   Occupational History  . Not on file.   Social History Main Topics  . Smoking status: Never Smoker  . Smokeless tobacco: Never Used  . Alcohol use Yes     Comment: Occasionally  . Drug use: No  . Sexual  activity: Yes   Other Topics Concern  . Not on file   Social History Narrative  . No narrative on file    Home Medications:  Medications reconciled in EPIC  No current facility-administered medications on file prior to encounter.    Current Outpatient Prescriptions on File Prior to Encounter  Medication Sig Dispense Refill  . oxyCODONE-acetaminophen (ROXICET) 5-325 MG tablet Take 1 tablet by mouth every 6 (six) hours as needed for severe pain. 6 tablet 0    Allergies:  Allergies  Allergen Reactions  . Prednisone     Physical Exam:  Temp:  [98.6 F (37 C)] 98.6 F (37 C) (05/13 1407) Pulse Rate:  [58-85] 68 (05/13 1900) BP: (113-126)/(67-80) 117/75 (05/13 1900) SpO2:  [97 %-100 %] 100 % (05/13 1900) Weight:  [84.4 kg (186 lb)] 84.4 kg (186 lb) (05/13 1408)   General Appearance:  Well developed, well nourished, in acute distress, alert and oriented, cooperative and appears stated age HEENT:  Normocephalic atraumatic, extraocular movements intact, moist mucous membranes, neck supple with midline trachea and thyroid without masses Cardiovascular:  Normal S1/S2, regular rate and rhythm, no murmurs, 2+ distal pulses Pulmonary:  clear to auscultation, no wheezes, rales or rhonchi, symmetric air entry, good air exchange Abdomen:  Bowel sounds present, soft, prominently tender in the LLQ medial to ASIS, nondistended, no abnormal masses or organomegaly, no epigastric pain Back: inspection of back is normal Extremities:  extremities normal, no tenderness, atraumatic, no cyanosis or edema Skin:  normal coloration and turgor, no rashes, no suspicious skin  lesions noted  Neurologic:  Cranial nerves 2-12 grossly intact, grossly equal strength and muscle tone, normal speech, no focal findings or movement disorder noted. Psychiatric:  Normal mood and affect, appropriate, no AH/VH Pelvic:  Deferred, was seen recently in the office and examined   Labs/Studies:   CBC and Coags:  Lab  Results  Component Value Date   WBC 9.8 08/07/2016   NEUTOPHILPCT 90.5 01/04/2013   EOSPCT 0.1 01/04/2013   BASOPCT 0.2 01/04/2013   LYMPHOPCT 6.5 01/04/2013   HGB 15.6 08/07/2016   HCT 44.4 08/07/2016   MCV 88.2 08/07/2016   PLT 305 08/07/2016   CMP:  Lab Results  Component Value Date   NA 140 08/07/2016   K 3.8 08/07/2016   CL 111 08/07/2016   CO2 23 08/07/2016   BUN 14 08/07/2016   CREATININE 0.94 08/07/2016   CREATININE 0.91 08/01/2016   PROT 7.4 08/07/2016   BILITOT 1.1 08/07/2016   ALT 20 08/07/2016   AST 22 08/07/2016   ALKPHOS 91 08/07/2016   Other Labs: Neg UPT B+ UA neg  TVUS:   [place] Transvaginal Non-ob  Result Date: 08/07/2016 CLINICAL DATA:  Left lower quadrant pain.  History of endometriosis. EXAM: TRANSABDOMINAL AND TRANSVAGINAL ULTRASOUND OF PELVIS TECHNIQUE: Both transabdominal and transvaginal ultrasound examinations of the pelvis were performed. Transabdominal technique was performed for global imaging of the pelvis including uterus, ovaries, adnexal regions, and pelvic cul-de-sac. It was necessary to proceed with endovaginal exam following the transabdominal exam to visualize the endometrium and ovaries. COMPARISON:  CT scan Aug 02, 2016 FINDINGS: Uterus Measurements: 7.6 x 3.3 x 4.0 cm. No fibroids or other mass visualized. Endometrium Thickness: 5.6 mm. Two tiny echogenic foci in the endometrium of doubtful significance. Right ovary Measurements: 3.8 x 2.4 x 2.4 cm. Normal appearance/no adnexal mass. Left ovary Measurements: 3.6 x 1.9 x 2.1 cm. Normal appearance/no adnexal mass. Other findings No abnormal free fluid. IMPRESSION: 1. No acute abnormalities. No cause for the patient's pain identified. Electronically Signed   By: Gerome Samavid  Williams III M.D   On: 08/07/2016 16:19   [place] Pelvis Complete   Ct Renal Stone Study  Result Date: 08/02/2016 CLINICAL DATA:  Acute onset of left upper quadrant abdominal pain and vomiting. Initial encounter. EXAM: CT ABDOMEN  AND PELVIS WITHOUT CONTRAST TECHNIQUE: Multidetector CT imaging of the abdomen and pelvis was performed following the standard protocol without IV contrast. COMPARISON:  CT of the abdomen and pelvis, and pelvic ultrasound, performed 01/28/2011 FINDINGS: Lower chest: The visualized lung bases are grossly clear. The visualized portions of the mediastinum are unremarkable. Hepatobiliary: The liver is unremarkable in appearance. The gallbladder is unremarkable in appearance. The common bile duct remains normal in caliber. Pancreas: The pancreas is within normal limits. Spleen: The spleen is unremarkable in appearance. Adrenals/Urinary Tract: The adrenal glands are unremarkable in appearance. The kidneys are within normal limits. There is no evidence of hydronephrosis. No renal or ureteral stones are identified. No perinephric stranding is seen. Stomach/Bowel: The stomach is unremarkable in appearance. The small bowel is within normal limits. The appendix is normal in caliber, without evidence of appendicitis. The colon is unremarkable in appearance. Vascular/Lymphatic: The abdominal aorta is unremarkable in appearance. The inferior vena cava is grossly unremarkable. No retroperitoneal lymphadenopathy is seen. No pelvic sidewall lymphadenopathy is identified. Reproductive: The bladder is mildly distended and within normal limits. The uterus is grossly unremarkable in appearance. The ovaries are relatively symmetric. No suspicious adnexal masses are seen. Other: No additional soft  tissue abnormalities are seen. Musculoskeletal: No acute osseous abnormalities are identified. The visualized musculature is unremarkable in appearance. IMPRESSION: Unremarkable noncontrast CT of the abdomen and pelvis. Electronically Signed   By: Roanna Raider M.D.   On: 08/02/2016 02:35   Assessment / Plan:   Angela Clark is a 31 y.o. G2P1011 with acute on chronic LLQ pain.  1. On my review of the images, the left ovary is elevated  above the uterus, and not where I would expect to find it.  No stone seen on CT, or ultrasound. No blood in urine.  I presume the cause of her pain is this ovarian complex, scar tissue, or some combination of that.  I have consented Aesha for a diagnostic laparoscopy, with possible salpingectomy, possible oophorectomy, possible lysis of adhesions, and other procedures as indicated.  She will be brought from the ED to the OR when the time is available in the OR.  She may go to the floor first if that time is beyond a reasonable stay in the ED.  IV pain meds PRN   2. Depending on scope of surgery she may go home after the case, or may stay overnight.  She has been admitted to OBS for now.   ----- Ranae Plumber, MD Attending Obstetrician and Gynecologist [facility], Department of OB/GYN [facility]

## 2016-08-08 ENCOUNTER — Encounter: Payer: Self-pay | Admitting: Obstetrics & Gynecology

## 2016-08-08 MED ORDER — SILVER NITRATE-POT NITRATE 75-25 % EX MISC
CUTANEOUS | Status: AC
  Filled 2016-08-08: qty 2

## 2016-08-08 MED ORDER — FENTANYL CITRATE (PF) 100 MCG/2ML IJ SOLN: 25.0000 ug | INTRAMUSCULAR | Status: DC | PRN

## 2016-08-08 MED ORDER — PROMETHAZINE HCL 25 MG/ML IJ SOLN: 6.2500 mg | INTRAMUSCULAR | Status: DC | PRN

## 2016-08-08 MED ORDER — SILVER NITRATE-POT NITRATE 75-25 % EX MISC
CUTANEOUS | Status: DC | PRN
  Administered 2016-08-08: 4

## 2016-08-08 MED ORDER — MORPHINE SULFATE (PF) 2 MG/ML IV SOLN
1.0000 mg | INTRAVENOUS | Status: DC | PRN
  Administered 2016-08-08: 1 mg via INTRAVENOUS
  Filled 2016-08-08: qty 1

## 2016-08-08 MED ORDER — IBUPROFEN 600 MG PO TABS
600.0000 mg | ORAL_TABLET | Freq: Four times a day (QID) | ORAL | Status: DC
  Administered 2016-08-08 (×2): 600 mg via ORAL
  Filled 2016-08-08 (×9): qty 1

## 2016-08-08 MED ORDER — HYDROMORPHONE HCL 1 MG/ML IJ SOLN: 0.5000 mg | INTRAMUSCULAR | Status: DC | PRN

## 2016-08-08 MED ORDER — OXYCODONE HCL 5 MG PO TABS: 5.0000 mg | ORAL_TABLET | ORAL | Status: DC | PRN

## 2016-08-08 MED ORDER — IBUPROFEN 600 MG PO TABS: 600.0000 mg | ORAL_TABLET | Freq: Four times a day (QID) | ORAL | 1 refills | Status: DC

## 2016-08-08 MED ORDER — SUGAMMADEX SODIUM 200 MG/2ML IV SOLN
INTRAVENOUS | Status: DC | PRN
  Administered 2016-08-08: 170 mg via INTRAVENOUS

## 2016-08-08 MED ORDER — OXYCODONE HCL 5 MG PO TABS: 5.0000 mg | ORAL_TABLET | ORAL | 0 refills | Status: DC | PRN

## 2016-08-08 MED ORDER — ACETAMINOPHEN 500 MG PO TABS
1000.0000 mg | ORAL_TABLET | Freq: Four times a day (QID) | ORAL | 0 refills | Status: DC
Start: 2016-08-08 — End: 2017-06-20

## 2016-08-08 MED ORDER — ACETAMINOPHEN 500 MG PO TABS
1000.0000 mg | ORAL_TABLET | Freq: Four times a day (QID) | ORAL | Status: DC
  Administered 2016-08-08 (×2): 1000 mg via ORAL
  Filled 2016-08-08: qty 2

## 2016-08-08 NOTE — Anesthesia Postprocedure Evaluation (Signed)
Anesthesia Post Note  Patient: Angela BeetsLaura K Clark  Procedure(s) Performed: Procedure(s) (LRB): LAPAROSCOPY OPERATIVE (N/A) LAPAROSCOPIC LEFT  SALPINGO OOPHORECTOMY (Left)  Patient location during evaluation: PACU Anesthesia Type: General Level of consciousness: awake and alert Pain management: pain level controlled Vital Signs Assessment: post-procedure vital signs reviewed and stable Respiratory status: spontaneous breathing, nonlabored ventilation, respiratory function stable and patient connected to nasal cannula oxygen Cardiovascular status: blood pressure returned to baseline and stable Postop Assessment: no signs of nausea or vomiting Anesthetic complications: no     Last Vitals:  Vitals:   08/08/16 0115 08/08/16 0131  BP: 119/68 115/65  Pulse: 82 79  Resp: 14 18  Temp:  36.8 C    Last Pain:  Vitals:   08/08/16 0131  TempSrc: Oral  PainSc:                  Lenard SimmerAndrew Fenix Rorke

## 2016-08-08 NOTE — Transfer of Care (Signed)
Immediate Anesthesia Transfer of Care Note  Patient: Angela BeetsLaura K Clark  Procedure(s) Performed: Procedure(s): LAPAROSCOPY OPERATIVE (N/A) LAPAROSCOPIC LEFT  SALPINGO OOPHORECTOMY (Left)  Patient Location: PACU  Anesthesia Type:General  Level of Consciousness: sedated and responds to stimulation  Airway & Oxygen Therapy: Patient Spontanous Breathing and Patient connected to face mask oxygen  Post-op Assessment: Report given to RN and Post -op Vital signs reviewed and stable  Post vital signs: Reviewed and stable  Last Vitals:  Vitals:   08/07/16 2030 08/08/16 0022  BP: 122/77 128/77  Pulse: 78 95  Resp:  13  Temp:      Last Pain:  Vitals:   08/07/16 2027  TempSrc:   PainSc: 5          Complications: No apparent anesthesia complications

## 2016-08-08 NOTE — Progress Notes (Signed)
Obstetric and Gynecology  POD 1  Subjective  Patient doing well, no complaints, tolerating PO intake, tolerating pain with PO meds, voiding spontaneously, has only ambulated to bathroom once.  States the pain that brought her in has subsided.     Denies CP, SOB, F/C, N/V/D, or leg pain.   Objective  Objective:   Vitals:   08/08/16 0202 08/08/16 0300 08/08/16 0400 08/08/16 0734  BP: (!) 107/53 113/65 111/61 113/63  Pulse: 75 71 63 70  Resp: 18 20 20 18   Temp: 98.2 F (36.8 C) 98.4 F (36.9 C) 98.6 F (37 C) 97.8 F (36.6 C)  TempSrc: Oral Oral Oral Oral  SpO2:    97%  Weight:      Height:         General: NAD Cardiovascular: RRR, no murmurs Pulmonary: CTAB, normal respiratory effort Abdomen: Benign. Appropriately tender, +BS, no guarding. Incisions c/d/i Extremities: No erythema or cords, no calf tenderness, with normal peripheral pulses.   Assessment   31 y.o.  Hospital Day: 2 POD 1 from Lap LSO, peritoneal stripping  Plan   1. Post op cares:  Continue routine 2. D/c IV, only PO meds for pain control. 3. Ambulate 4. Anticipate D/C later this morning.  ----- Ranae Plumberhelsea Arbadella Kimbler, MD Attending Obstetrician and Gynecologist [facility], Department of OB/GYN [facility]

## 2016-08-08 NOTE — Op Note (Signed)
Angela Clark PROCEDURE DATE: 08/07/2016 - 08/08/2016  PATIENT:  Angela Clark  31 y.o. female  PRE-OPERATIVE DIAGNOSIS:  pelvic pain  POST-OPERATIVE DIAGNOSIS:  pelvic pain  PROCEDURE:  Procedure(s): LAPAROSCOPY OPERATIVE (N/A) LAPAROSCOPIC LEFT  SALPINGO OOPHORECTOMY (Left)  Peritoneal stripping  SURGEON:  Surgeon(s) and Role:    * Ward, Elenora Fender, MD - Primary  ANESTHESIA:  General via ET  I/O  Total I/O In: 900 [I.V.:900] Out: 525 [Urine:500; Blood:25]  FINDINGS:  Small uterus, normal ovaries and fallopian tubes bilaterally.  Normal upper abdomen.  Left ovarian complex had vascular congestion around the IP, and was tethered cephalad to the uterus, instead of lying beneath it.  Physiologic adhesions of the sigmoid were abutting the IP at the ovary.  Left ureter vermiculating.  Brown/red flame-like superficial deposits on anterior/bladder peritoneum and posterior culdesac, which was completely intact.  There was no apparent scarring, and no gross ovarian involvement.  ASRM stage 1.  SPECIMEN: Left tube and ovary, bladder peritoneum  COMPLICATIONS: none apparent  DISPOSITION: vital signs stable to PACU   Indication for Surgery: 31 y.o. G2P1011 who presented to the ED with continued left sided pelvic pain.  Ureteral stone had been ruled out.  Risks of surgery were discussed with the patient including but not limited to: bleeding which may require transfusion or reoperation; infection which may require antibiotics; injury to bowel, bladder, ureters or other surrounding organs; need for additional procedures including laparotomy, blood clot, incisional problems and other postoperative/anesthesia complications. Written informed consent was obtained.     PROCEDURE IN DETAIL:  The patient had sequential compression devices applied to her lower extremities while in the preoperative area.  She was then taken to the operating room where general anesthesia was administered and was found to  be adequate.  She was placed in the dorsal lithotomy position, and was prepped and draped in a sterile manner.  A Foley catheter was inserted into her bladder and attached to constant drainage and a uterine manipulator was then advanced into the uterus .  After a surgical timeout was performed, attention was turned to the abdomen where an umbilical incision was made with the scalpel. A 5mm trochar was inserted in the umbilical incision using a visiport method.  Opening pressure was , and the abdomen was insufflated to 15mg Hg carbon dioxide gas and adequate pneumoperitoneum was obtained.  A survey of the patient's pelvis and abdomen revealed the findings as mentioned above. Two 5mm ports were inserted in the lower left and right quadrants under visualization.    The initial survey showed superficial endometriosis implants along the anterior/bladder peritoneum, and in the posterior culdesac.  The right tube and ovary were normal.  The left tube was normal, and the left ovary also appeared normal.  There was vascular congestion along the IP and left ovary, and some tethering of the ovary rostrally, but no obvious source of patient's pain. The laparoscopic bovie hook was used to separate the peritoneum along the right pelvic brim where the bowel was adherent to the side wall (physiologic).  This dropped the bowel down, and the remainder of the bowel adhesions around the IP ligament were dissected down.  Further congestion of the vasculature was uncovered; the ureter was observed far from the IP ligament.  Although we had discussed in detail the possibilities of removal of the ovary, we did so under the pretense that something may be wrong with it and it would need to be removed, not an outright removal  of a normal appearing organ.  I left the OR to discuss this with her husband and mother.  I informed them of the vascular congestion, which could potentially be treated by IR, and potentially solve her pain and  preserve her ovary. The other option was to remove the ovary and resolve the congestion, which may or may not ameliorate her symptoms.  Due to the chronic nature of her pain, it's location, and her determination to resolve her symptoms, her family agreed to proceed with an oophorectomy.  I returned to the OR promptly after this decision was made.  The IP was further skeletonized and the underlying tissue was opened to isolate the vascular complex.  This was drawn medially and the Ligasure was used to thrice cauterize the vessels and transect them.  The pedicle and surrounding tissues  were hemostatic.  The tissues along the ovary were then divided to the utero-ovarian ligament, with the goal to preserve the fallopian tube.  The attention was turned to the bladder peritoneum.  A line of demarcation was made with the bovie hook, from the left round ligament, across the bladder to the right round ligament, and then across the vesicouterine junction.  The peritoneum was then undermined bluntly with a grasper, and gently peeled from its underlying tissues. The site was hemostatic.  The umbilical port site was converted into an 11mm trochar, and an endocatch bag was inserted, where the peritoneum and ovary were placed inside.  This remained insitu until the conclusion of the case.  Interceed adhesion barrier was placed along the vesicouterine area.  The intraabdominal pressure was lowered to 7 and the surgial sites observed for hemostasis, which was confirmed.  The left tube had turned purple during the remainder of the case, thus confirming the blood supply had been damaged during removal of the ovary; it was removed with the Ligasure, and through the 11mm port.   No intraoperative injury to surrounding organs was noted. The two 5mm trochars were removed, and the 11mm trochar and endocatch bag were were removed intact.  The abdomen was deflated.  The fascia of the 11mm trochar site was closed with 2-0 vicryl.     The uterine manipulator was removed without complications.  All skin incisions were closed with 4-0 monocryl and covered with surgical glue. The patient tolerated the procedures well.  All instruments, needles, and sponge counts were correct x 2. The patient was taken to the recovery room in stable condition.   ---- Ranae Plumberhelsea Ward, MD Attending Obstetrician and Gynecologist [facility]

## 2016-08-08 NOTE — Anesthesia Post-op Follow-up Note (Cosign Needed)
Anesthesia QCDR form completed.        

## 2016-08-08 NOTE — Discharge Instructions (Signed)
Discharge instructions:  Call office if you have any of the following: fever >101 F, chills, excessive vaginal bleeding, incision drainage or problems, leg pain or redness, or any other concerns.   Activity: Do not lift > 15 lbs for 2 weeks.  No driving until you are able to slam on the brakes, or on narcotics.  You may feel some pain in your upper right abdomen/rib and right shoulder.  This is from the gas in the abdomen for surgery. This will subside over time, please be patient!  Take 600mg  Ibuprofen and 1000mg  Tylenol around the clock, every 6 hours for at least the first 3-5 days.  After this you can take as needed.  This will help decrease inflammation and promote healing.  The narcotics you'll take just as needed, as they just trick your brain into thinking its not in pain.    Please don't limit yourself in terms of routine activity.  You will be able to do most things, although they may take longer to do or be a little painful.  You can do it!  Don't be a hero, but don't be a wimp either!

## 2016-08-08 NOTE — Discharge Summary (Signed)
Gynecology Physician Postoperative Discharge Summary  Patient ID: Angela Clark MRN: [id number] DOB/AGE: 1985/05/08 31 y.o.  Admit Date: 08/07/2016 Discharge Date: 08/08/2016  Preoperative Diagnoses: acute pelvic pain Postoperative Diagnoses:  same  Procedures: Procedure(s) (LRB): LAPAROSCOPY OPERATIVE (N/A) LAPAROSCOPIC LEFT  SALPINGO OOPHORECTOMY (Left)  CBC Latest Ref Rng & Units 08/07/2016 08/01/2016 01/05/2013  WBC 3.6 - 11.0 K/uL 9.8 10.9 -  Hemoglobin 12.0 - 16.0 g/dL 40.9 81.1 -  Hematocrit 35.0 - 47.0 % 44.4 42.3 33.1(L)  Platelets 150 - 440 K/uL 305 318 -    Hospital Course:  Angela Clark is a 31 y.o. admitted for emergency surgery.  She underwent the procedures as mentioned above, her operation was uncomplicated. For further details about surgery, please refer to the operative report. Patient had an uncomplicated postoperative course. By time of discharge on POD#1, her pain was controlled on oral pain medications; she was ambulating, voiding without difficulty, tolerating regular diet and passing flatus. She was deemed stable for discharge to home.   Discharge Exam: Blood pressure 113/63, pulse 70, temperature 97.8 F (36.6 C), temperature source Oral, resp. rate 18, height 5\' 6"  (1.676 m), weight 84.4 kg (186 lb), last menstrual period 07/21/2016, SpO2 97 %. General appearance: alert and no distress  Resp: clear to auscultation bilaterally, normal respiratory effort Cardio: regular rate and rhythm  GI: soft, non-tender; bowel sounds normal; no masses, no organomegaly.  Incisions: C/D/I, no erythema, no drainage noted Pelvic: scant blood on pad  Extremities: extremities normal, atraumatic, no cyanosis or edema and Homans sign is negative, no sign of DVT  Discharged Condition: Stable  Disposition: 01-Home or Self Care  Discharge Instructions    Diet - low sodium heart healthy    Complete by:  As directed    Increase activity slowly    Complete by:  As directed       Allergies as of 08/08/2016      Reactions   Prednisone       Medication List    STOP taking these medications   oxyCODONE-acetaminophen 5-325 MG tablet Commonly known as:  ROXICET     TAKE these medications   acetaminophen 500 MG tablet Commonly known as:  TYLENOL Take 2 tablets (1,000 mg total) by mouth every 6 (six) hours.   ALPRAZolam 0.25 MG tablet Commonly known as:  XANAX Take 1 tablet by mouth daily as needed.   escitalopram 10 MG tablet Commonly known as:  LEXAPRO Take 1 tablet by mouth daily.   HYDROcodone-acetaminophen 5-325 MG tablet Commonly known as:  NORCO/VICODIN Take 1 tablet by mouth every 6 (six) hours as needed.   ibuprofen 600 MG tablet Commonly known as:  ADVIL,MOTRIN Take 1 tablet (600 mg total) by mouth every 6 (six) hours. What changed:  medication strength  how much to take  when to take this  reasons to take this   levothyroxine 25 MCG tablet Commonly known as:  SYNTHROID, LEVOTHROID Take 1 tablet by mouth daily.   oxyCODONE 5 MG immediate release tablet Commonly known as:  Oxy IR/ROXICODONE Take 1-2 tablets (5-10 mg total) by mouth every 4 (four) hours as needed for moderate pain.   SUMAtriptan 50 MG tablet Commonly known as:  IMITREX Take 1 tablet by mouth daily as needed.   topiramate 100 MG tablet Commonly known as:  TOPAMAX Take 1 tablet by mouth daily.      Follow-up Information    Assunta Pupo, Elenora Fender, MD Follow up in 2 week(s).   Specialty:  Obstetrics and Gynecology Contact information: [address] [facility] [place] [id number] [phone]           Signed:  Elenora Fenderhelsea C Aracely Rickett Attending Obstetrician & Gynecologist [facility]

## 2016-08-08 NOTE — Progress Notes (Signed)
Discharge order received from doctor. Reviewed discharge instructions and prescriptions with patient and answered all questions. Follow up appointment given. Patient verbalized understanding. Patient discharged home in stable condition via wheelchair by nursing/auxillary.    Orton Capell Garner, RN  

## 2016-08-09 LAB — SURGICAL PATHOLOGY

## 2016-08-18 ENCOUNTER — Inpatient Hospital Stay
Admission: EM | Admit: 2016-08-18 | Discharge: 2016-08-19 | DRG: 683 | Disposition: A | Discharge status: Home / Self Care | Payer: 59 | Attending: Specialist | Admitting: Specialist

## 2016-08-18 ENCOUNTER — Emergency Department: Payer: 59

## 2016-08-18 ENCOUNTER — Encounter: Payer: Self-pay | Admitting: Emergency Medicine

## 2016-08-18 DIAGNOSIS — A419 Sepsis, unspecified organism: Secondary | ICD-10-CM | POA: Diagnosis present

## 2016-08-18 DIAGNOSIS — Z833 Family history of diabetes mellitus: Secondary | ICD-10-CM

## 2016-08-18 DIAGNOSIS — E063 Autoimmune thyroiditis: Secondary | ICD-10-CM | POA: Diagnosis present

## 2016-08-18 DIAGNOSIS — R000 Tachycardia, unspecified: Secondary | ICD-10-CM | POA: Diagnosis present

## 2016-08-18 DIAGNOSIS — G43909 Migraine, unspecified, not intractable, without status migrainosus: Secondary | ICD-10-CM | POA: Diagnosis present

## 2016-08-18 DIAGNOSIS — A0811 Acute gastroenteropathy due to Norwalk agent: Secondary | ICD-10-CM | POA: Diagnosis present

## 2016-08-18 DIAGNOSIS — F329 Major depressive disorder, single episode, unspecified: Secondary | ICD-10-CM | POA: Diagnosis present

## 2016-08-18 DIAGNOSIS — E860 Dehydration: Secondary | ICD-10-CM | POA: Diagnosis present

## 2016-08-18 DIAGNOSIS — N179 Acute kidney failure, unspecified: Principal | ICD-10-CM | POA: Diagnosis present

## 2016-08-18 DIAGNOSIS — F419 Anxiety disorder, unspecified: Secondary | ICD-10-CM | POA: Diagnosis present

## 2016-08-18 DIAGNOSIS — Z905 Acquired absence of kidney: Secondary | ICD-10-CM

## 2016-08-18 LAB — URINALYSIS, COMPLETE (UACMP) WITH MICROSCOPIC
BILIRUBIN URINE: NEGATIVE
Glucose, UA: NEGATIVE mg/dL
Ketones, ur: NEGATIVE mg/dL
Leukocytes, UA: NEGATIVE
Nitrite: NEGATIVE
Protein, ur: 30 mg/dL — AB
SPECIFIC GRAVITY, URINE: 1.025 (ref 1.005–1.030)
pH: 5 (ref 5.0–8.0)

## 2016-08-18 LAB — COMPREHENSIVE METABOLIC PANEL
ALT: 46 U/L (ref 14–54)
AST: 37 U/L (ref 15–41)
Albumin: 5.4 g/dL — ABNORMAL HIGH (ref 3.5–5.0)
Alkaline Phosphatase: 96 U/L (ref 38–126)
Anion gap: 15 (ref 5–15)
BILIRUBIN TOTAL: 1.4 mg/dL — AB (ref 0.3–1.2)
BUN: 23 mg/dL — AB (ref 6–20)
CO2: 19 mmol/L — ABNORMAL LOW (ref 22–32)
CREATININE: 1.35 mg/dL — AB (ref 0.44–1.00)
Calcium: 10 mg/dL (ref 8.9–10.3)
Chloride: 106 mmol/L (ref 101–111)
GFR calc Af Amer: 60 mL/min — ABNORMAL LOW (ref >60)
GFR, EST NON AFRICAN AMERICAN: 52 mL/min — AB (ref >60)
Glucose, Bld: 166 mg/dL — ABNORMAL HIGH (ref 65–99)
Potassium: 3.8 mmol/L (ref 3.5–5.1)
Sodium: 140 mmol/L (ref 135–145)
TOTAL PROTEIN: 8.7 g/dL — AB (ref 6.5–8.1)

## 2016-08-18 LAB — CBC
HCT: 48.2 % — ABNORMAL HIGH (ref 35.0–47.0)
Hemoglobin: 16.6 g/dL — ABNORMAL HIGH (ref 12.0–16.0)
MCH: 30.4 pg (ref 26.0–34.0)
MCHC: 34.5 g/dL (ref 32.0–36.0)
MCV: 88.3 fL (ref 80.0–100.0)
PLATELETS: 329 10*3/uL (ref 150–440)
RBC: 5.46 MIL/uL — ABNORMAL HIGH (ref 3.80–5.20)
RDW: 13.3 % (ref 11.5–14.5)
WBC: 22.6 10*3/uL — AB (ref 3.6–11.0)

## 2016-08-18 LAB — LACTIC ACID, PLASMA
LACTIC ACID, VENOUS: 2.6 mmol/L — AB (ref 0.5–1.9)
Lactic Acid, Venous: 4 mmol/L (ref 0.5–1.9)

## 2016-08-18 LAB — POCT PREGNANCY, URINE: PREG TEST UR: NEGATIVE

## 2016-08-18 LAB — LIPASE, BLOOD: Lipase: 20 U/L (ref 11–51)

## 2016-08-18 MED ORDER — LEVOTHYROXINE SODIUM 50 MCG PO TABS
25.0000 ug | ORAL_TABLET | Freq: Every day | ORAL | Status: DC
  Administered 2016-08-18: 25 ug via ORAL
  Filled 2016-08-18 (×2): qty 1

## 2016-08-18 MED ORDER — ESCITALOPRAM OXALATE 10 MG PO TABS
10.0000 mg | ORAL_TABLET | Freq: Every day | ORAL | Status: DC
  Administered 2016-08-18 – 2016-08-19 (×2): 10 mg via ORAL
  Filled 2016-08-18 (×2): qty 1

## 2016-08-18 MED ORDER — ACETAMINOPHEN 325 MG PO TABS: 650.0000 mg | ORAL_TABLET | Freq: Four times a day (QID) | ORAL | Status: DC | PRN

## 2016-08-18 MED ORDER — PIPERACILLIN-TAZOBACTAM 3.375 G IVPB
3.3750 g | Freq: Three times a day (TID) | INTRAVENOUS | Status: DC
  Administered 2016-08-19: 3.375 g via INTRAVENOUS
  Filled 2016-08-18 (×4): qty 50

## 2016-08-18 MED ORDER — ONDANSETRON HCL 4 MG PO TABS: 4.0000 mg | ORAL_TABLET | Freq: Four times a day (QID) | ORAL | Status: DC | PRN

## 2016-08-18 MED ORDER — METRONIDAZOLE 500 MG PO TABS
500.0000 mg | ORAL_TABLET | Freq: Three times a day (TID) | ORAL | Status: DC
  Administered 2016-08-18 – 2016-08-19 (×2): 500 mg via ORAL
  Filled 2016-08-18 (×2): qty 1

## 2016-08-18 MED ORDER — ONDANSETRON 4 MG PO TBDP
4.0000 mg | ORAL_TABLET | Freq: Once | ORAL | Status: AC | PRN
  Administered 2016-08-18: 4 mg via ORAL
  Filled 2016-08-18: qty 1

## 2016-08-18 MED ORDER — VANCOMYCIN HCL IN DEXTROSE 1-5 GM/200ML-% IV SOLN
1000.0000 mg | Freq: Once | INTRAVENOUS | Status: AC
  Administered 2016-08-18: 1000 mg via INTRAVENOUS

## 2016-08-18 MED ORDER — TOPIRAMATE 100 MG PO TABS
100.0000 mg | ORAL_TABLET | Freq: Every day | ORAL | Status: DC
  Administered 2016-08-18 – 2016-08-19 (×2): 100 mg via ORAL
  Filled 2016-08-18 (×2): qty 1

## 2016-08-18 MED ORDER — SODIUM CHLORIDE 0.9 % IV BOLUS (SEPSIS)
1000.0000 mL | Freq: Once | INTRAVENOUS | Status: AC
  Administered 2016-08-18: 1000 mL via INTRAVENOUS

## 2016-08-18 MED ORDER — POTASSIUM CHLORIDE IN NACL 20-0.9 MEQ/L-% IV SOLN
INTRAVENOUS | Status: DC
  Administered 2016-08-18 – 2016-08-19 (×2): via INTRAVENOUS
  Filled 2016-08-18 (×5): qty 1000

## 2016-08-18 MED ORDER — ACETAMINOPHEN 650 MG RE SUPP: 650.0000 mg | Freq: Four times a day (QID) | RECTAL | Status: DC | PRN

## 2016-08-18 MED ORDER — PIPERACILLIN-TAZOBACTAM 3.375 G IVPB 30 MIN
3.3750 g | Freq: Once | INTRAVENOUS | Status: AC
  Administered 2016-08-18: 3.375 g via INTRAVENOUS
  Filled 2016-08-18 (×2): qty 50

## 2016-08-18 MED ORDER — ACETAMINOPHEN 500 MG PO TABS
1000.0000 mg | ORAL_TABLET | Freq: Once | ORAL | Status: AC
  Administered 2016-08-18: 1000 mg via ORAL

## 2016-08-18 MED ORDER — ENOXAPARIN SODIUM 40 MG/0.4ML ~~LOC~~ SOLN
40.0000 mg | SUBCUTANEOUS | Status: DC
  Administered 2016-08-18: 40 mg via SUBCUTANEOUS
  Filled 2016-08-18: qty 0.4

## 2016-08-18 MED ORDER — ONDANSETRON HCL 4 MG/2ML IJ SOLN: 4.0000 mg | Freq: Four times a day (QID) | INTRAMUSCULAR | Status: DC | PRN

## 2016-08-18 MED ORDER — SODIUM CHLORIDE 0.9 % IV BOLUS (SEPSIS): 500.0000 mL | Freq: Once | INTRAVENOUS | Status: DC

## 2016-08-18 MED ORDER — IOPAMIDOL (ISOVUE-300) INJECTION 61%
100.0000 mL | Freq: Once | INTRAVENOUS | Status: AC | PRN
  Administered 2016-08-18: 100 mL via INTRAVENOUS
  Filled 2016-08-18: qty 100

## 2016-08-18 MED ORDER — HYDROCODONE-ACETAMINOPHEN 5-325 MG PO TABS
1.0000 | ORAL_TABLET | ORAL | Status: DC | PRN
  Administered 2016-08-18: 1 via ORAL
  Filled 2016-08-18: qty 1

## 2016-08-18 MED ORDER — ALPRAZOLAM 0.25 MG PO TABS: 0.2500 mg | ORAL_TABLET | Freq: Every day | ORAL | Status: DC | PRN

## 2016-08-18 MED ORDER — ACETAMINOPHEN 500 MG PO TABS
ORAL_TABLET | ORAL | Status: AC
  Filled 2016-08-18: qty 2

## 2016-08-18 MED ORDER — VANCOMYCIN HCL IN DEXTROSE 1-5 GM/200ML-% IV SOLN
INTRAVENOUS | Status: AC
  Filled 2016-08-18: qty 200

## 2016-08-18 NOTE — H&P (Signed)
[facility]   PATIENT NAME: Angela Clark    MR#:  [id number]  DATE OF BIRTH:  1985-12-12  DATE OF ADMISSION:  08/18/2016  PRIMARY CARE PHYSICIAN: Jerl Mina, MD   REQUESTING/REFERRING PHYSICIAN: Dr. Derrill Kay  CHIEF COMPLAINT:   Chief Complaint  Patient presents with  . Emesis  . Diarrhea  . Post-op Problem    HISTORY OF PRESENT ILLNESS:  Angela Clark  is a 31 y.o. female with a known history of Hashimoto's and recent left nephrectomy presents to the hospital complaining of acute onset of right flank pain, multiple rounds of vomiting and watery diarrhea. No blood noticed. No dysuria. Patient was seen in the gynecology office and referred to the emergency room. Here CT scan of the abdomen was checked which shows nothing acute. Patient has fever, tachycardia, leukocytosis and elevated lactic acid of 4. Being admitted for sepsis for intra-abdominal source of infection. No recent antibiotic use.  PAST MEDICAL HISTORY:   Past Medical History:  Diagnosis Date  . Endometriosis   Hashimoto's  PAST SURGICAL HISTORY:   Past Surgical History:  Procedure Laterality Date  . LAPAROSCOPIC BILATERAL SALPINGO OOPHERECTOMY Left 08/07/2016   Procedure: LAPAROSCOPIC LEFT  SALPINGO OOPHORECTOMY;  Surgeon: Ward, Elenora Fender, MD;  Location: [facility] ORS;  Service: Gynecology;  Laterality: Left;  . LAPAROSCOPY N/A 08/07/2016   Procedure: LAPAROSCOPY OPERATIVE;  Surgeon: Ward, Elenora Fender, MD;  Location: [facility] ORS;  Service: Gynecology;  Laterality: N/A;  . TONSILLECTOMY      SOCIAL HISTORY:   Social History  Substance Use Topics  . Smoking status: Never Smoker  . Smokeless tobacco: Never Used  . Alcohol use Yes     Comment: Occasionally    FAMILY HISTORY:  History reviewed. No pertinent family history. Diabetes in her mother DRUG ALLERGIES:   Allergies  Allergen Reactions  . Prednisone     REVIEW OF SYSTEMS:   Review of Systems  Constitutional:  Positive for fever and malaise/fatigue. Negative for chills and weight loss.  HENT: Negative for hearing loss and nosebleeds.   Eyes: Negative for blurred vision, double vision and pain.  Respiratory: Negative for cough, hemoptysis, sputum production, shortness of breath and wheezing.   Cardiovascular: Negative for chest pain, palpitations, orthopnea and leg swelling.  Gastrointestinal: Positive for abdominal pain, diarrhea, nausea and vomiting. Negative for constipation.  Genitourinary: Negative for dysuria and hematuria.  Musculoskeletal: Negative for back pain, falls and myalgias.  Skin: Negative for rash.  Neurological: Positive for dizziness and weakness. Negative for tremors, sensory change, speech change, focal weakness, seizures and headaches.  Endo/Heme/Allergies: Does not bruise/bleed easily.  Psychiatric/Behavioral: Negative for depression and memory loss. The patient is not nervous/anxious.     MEDICATIONS AT HOME:   Prior to Admission medications   Medication Sig Start Date End Date Taking? Authorizing Provider  acetaminophen (TYLENOL) 500 MG tablet Take 2 tablets (1,000 mg total) by mouth every 6 (six) hours. 08/08/16  Yes Ward, Elenora Fender, MD  ALPRAZolam Prudy Feeler) 0.25 MG tablet Take 1 tablet by mouth daily as needed. 06/16/16  Yes [provider]  escitalopram (LEXAPRO) 10 MG tablet Take 1 tablet by mouth daily. 06/30/16  Yes [provider]  HYDROcodone-acetaminophen (NORCO/VICODIN) 5-325 MG tablet Take 1 tablet by mouth every 6 (six) hours as needed. 08/03/16  Yes [provider]  ibuprofen (ADVIL,MOTRIN) 600 MG tablet Take 1 tablet (600 mg total) by mouth every 6 (six) hours. 08/08/16  Yes Ward, Elenora Fender, MD  levothyroxine (SYNTHROID, LEVOTHROID) 25 MCG tablet Take 1 tablet by mouth daily. 06/30/16  Yes [provider]  SUMAtriptan (IMITREX) 50 MG tablet Take 1 tablet by mouth daily as needed.   Yes [provider]  topiramate (TOPAMAX)  100 MG tablet Take 1 tablet by mouth daily. 05/18/16  Yes [provider]  oxyCODONE (OXY IR/ROXICODONE) 5 MG immediate release tablet Take 1-2 tablets (5-10 mg total) by mouth every 4 (four) hours as needed for moderate pain. Patient not taking: Reported on 08/18/2016 08/08/16   Ward, Elenora Fender, MD     VITAL SIGNS:  Blood pressure 112/64, pulse (!) 101, temperature (!) 100.6 F (38.1 C), temperature source Oral, resp. rate 20, height 5\' 6"  (1.676 m), weight 79.8 kg (176 lb), last menstrual period 07/27/2016, SpO2 100 %.  PHYSICAL EXAMINATION:  Physical Exam  GENERAL:  31 y.o.-year-old patient lying in the bed with no acute distress.  EYES: Pupils equal, round, reactive to light and accommodation. No scleral icterus. Extraocular muscles intact.  HEENT: Head atraumatic, normocephalic. Oropharynx and nasopharynx clear. No oropharyngeal erythema, moist oral Dry NECK:  Supple, no jugular venous distention. No thyroid enlargement, no tenderness.  LUNGS: Normal breath sounds bilaterally, no wheezing, rales, rhonchi. No use of accessory muscles of respiration.  CARDIOVASCULAR: S1, S2 normal. No murmurs, rubs, or gallops.  ABDOMEN: Soft, tenderness in the right side. Bowel sounds increased. No rigidity or guarding. EXTREMITIES: No pedal edema, cyanosis, or clubbing. + 2 pedal & radial pulses b/l.   NEUROLOGIC: Cranial nerves II through XII are intact. No focal Motor or sensory deficits appreciated b/l PSYCHIATRIC: The patient is alert and oriented x 3. Good affect.  SKIN: No obvious rash, lesion, or ulcer.   LABORATORY PANEL:   CBC  Recent Labs Lab 08/18/16 1552  WBC 22.6*  HGB 16.6*  HCT 48.2*  PLT 329   ------------------------------------------------------------------------------------------------------------------  Chemistries   Recent Labs Lab 08/18/16 1552  NA 140  K 3.8  CL 106  CO2 19*  GLUCOSE 166*  BUN 23*  CREATININE 1.35*  CALCIUM 10.0  AST 37  ALT 46   ALKPHOS 96  BILITOT 1.4*   ------------------------------------------------------------------------------------------------------------------  Cardiac Enzymes No results for input(s): TROPONINI in the last 168 hours. ------------------------------------------------------------------------------------------------------------------  RADIOLOGY:  Dg Chest 2 View  Result Date: 08/18/2016 CLINICAL DATA:  31 y/o  F; fever. EXAM: CHEST  2 VIEW COMPARISON:  08/27/2015 chest radiograph. FINDINGS: Stable heart size and mediastinal contours are within normal limits. Both lungs are clear. The visualized skeletal structures are unremarkable. IMPRESSION: No active cardiopulmonary disease. Electronically Signed   By: Mitzi Hansen M.D.   On: 08/18/2016 19:13   Ct Abdomen Pelvis W Contrast  Result Date: 08/18/2016 CLINICAL DATA:  Abdominal pain and vomiting.  Diarrhea. EXAM: CT ABDOMEN AND PELVIS WITH CONTRAST TECHNIQUE: Multidetector CT imaging of the abdomen and pelvis was performed using the standard protocol following bolus administration of intravenous contrast. CONTRAST:  ISOVUE-300 IOPAMIDOL (ISOVUE-300) INJECTION 61% COMPARISON:  CT abdomen pelvis 08/02/2016 FINDINGS: Lower chest: No pulmonary nodules. No visible pleural or pericardial effusion. Hepatobiliary: Normal hepatic size and contours without focal liver lesion. No perihepatic ascites. No intra- or extrahepatic biliary dilatation. Normal gallbladder. Pancreas: Normal pancreatic contours and enhancement. No peripancreatic fluid collection or pancreatic ductal dilatation. Spleen: Normal. Adrenals/Urinary Tract: Normal adrenal glands. No hydronephrosis or solid renal mass. Stomach/Bowel: There is no hiatal hernia. The stomach and duodenum are normal. There is no dilated small bowel or enteric inflammation. There is no  colonic abnormality. The appendix is normal. Vascular/Lymphatic: Normal course and caliber of the major abdominal  vessels. No abdominal or pelvic adenopathy. Reproductive: The uterus is normal. The right ovary is normal. Status post left salpingo-oophorectomy. No free fluid in the pelvis. Musculoskeletal: No lytic or blastic osseous lesion. Normal visualized extrathoracic and extraperitoneal soft tissues. Other: No contributory non-categorized findings. IMPRESSION: No acute abnormality of the abdomen or pelvis. Electronically Signed   By: Deatra RobinsonKevin  Herman M.D.   On: 08/18/2016 18:22     IMPRESSION AND PLAN:   * Sepsis with gastroenteritis Could be C diff Check Stool for C. difficile and PCR. Isolation ordered We'll cover for intra-abdominal organisms with Zosyn. Also add Flagyl. Further management as per patient's progress and stool results. IV fluids. Repeat lactic acid.  * Acute kidney injury due to dehydration and sepsis. IV fluids. Repeat labs in the morning.  * DVT prophylaxis with Lovenox.  All the records are reviewed and case discussed with ED provider. Management plans discussed with the patient, family and they are in agreement.  CODE STATUS: FULL  TOTAL TIME TAKING CARE OF THIS PATIENT: 40 minutes.   Milagros LollSudini, Burdell Peed R M.D on 08/18/2016 at 7:54 PM  Between 7am to 6pm - Pager - [phone]  After 6pm go to [email or web address] - password [email or web address]  [facility] [place] [facility]  Office  [phone]  CC: Primary care physician; Jerl MinaHedrick, James, MD  Note: This dictation was prepared with Dragon dictation along with smaller phrase technology. Any transcriptional errors that result from this process are unintentional.

## 2016-08-18 NOTE — ED Provider Notes (Signed)
[facility] Emergency Department Provider Note   ____________________________________________   I have reviewed the triage vital signs and the nursing notes.   HISTORY  Chief Complaint Emesis; Diarrhea; and Post-op Problem   History limited by: Not Limited   HPI Angela Clark is a 31 y.o. female who presents to the emergency department today because of concerns for abdominal pain and fever. Patient states the abdominal pain started this morning at 3 AM. It woke her from sleep. It is located in the right lower quadrant. Patient states the pain is severe. It is sharp. It has been accompanied by diarrhea, nausea and some vomiting. Additionally the patient had a measured fever at home. Of note the patient had a laparoscopic surgery 11 days ago by OB/GYN for pelvic pain.   Past Medical History:  Diagnosis Date  . Endometriosis     Patient Active Problem List   Diagnosis Date Noted  . Pelvic pain 08/07/2016    Past Surgical History:  Procedure Laterality Date  . LAPAROSCOPIC BILATERAL SALPINGO OOPHERECTOMY Left 08/07/2016   Procedure: LAPAROSCOPIC LEFT  SALPINGO OOPHORECTOMY;  Surgeon: Ward, Elenora Fender, MD;  Location: [facility] ORS;  Service: Gynecology;  Laterality: Left;  . LAPAROSCOPY N/A 08/07/2016   Procedure: LAPAROSCOPY OPERATIVE;  Surgeon: Ward, Elenora Fender, MD;  Location: [facility] ORS;  Service: Gynecology;  Laterality: N/A;  . TONSILLECTOMY      Prior to Admission medications   Medication Sig Start Date End Date Taking? Authorizing Provider  acetaminophen (TYLENOL) 500 MG tablet Take 2 tablets (1,000 mg total) by mouth every 6 (six) hours. 08/08/16   Ward, Elenora Fender, MD  ALPRAZolam Prudy Feeler) 0.25 MG tablet Take 1 tablet by mouth daily as needed. 06/16/16   [provider]  escitalopram (LEXAPRO) 10 MG tablet Take 1 tablet by mouth daily. 06/30/16   [provider]  HYDROcodone-acetaminophen (NORCO/VICODIN) 5-325 MG tablet Take 1 tablet by mouth  every 6 (six) hours as needed. 08/03/16   [provider]  ibuprofen (ADVIL,MOTRIN) 600 MG tablet Take 1 tablet (600 mg total) by mouth every 6 (six) hours. 08/08/16   Ward, Elenora Fender, MD  levothyroxine (SYNTHROID, LEVOTHROID) 25 MCG tablet Take 1 tablet by mouth daily. 06/30/16   [provider]  oxyCODONE (OXY IR/ROXICODONE) 5 MG immediate release tablet Take 1-2 tablets (5-10 mg total) by mouth every 4 (four) hours as needed for moderate pain. 08/08/16   Ward, Elenora Fender, MD  SUMAtriptan (IMITREX) 50 MG tablet Take 1 tablet by mouth daily as needed.    [provider]  topiramate (TOPAMAX) 100 MG tablet Take 1 tablet by mouth daily. 05/18/16   [provider]    Allergies Prednisone  History reviewed. No pertinent family history.  Social History Social History  Substance Use Topics  . Smoking status: Never Smoker  . Smokeless tobacco: Never Used  . Alcohol use Yes     Comment: Occasionally    Review of Systems Constitutional: Positive for fever Eyes: No visual changes. ENT: No sore throat. Cardiovascular: Denies chest pain. Respiratory: Denies shortness of breath. Gastrointestinal: Positive for right lower quadrant pain, nausea, vomiting and diarrhea. Genitourinary: Negative for dysuria. Musculoskeletal: Negative for back pain. Skin: Negative for rash. Neurological: Negative for headaches, focal weakness or numbness.  ____________________________________________   PHYSICAL EXAM:  VITAL SIGNS: ED Triage Vitals [08/18/16 1551]  Enc Vitals Group     BP 103/81     Pulse Rate (!) 120     Resp  Temp 99.6 F (37.6 C)     Temp Source Oral     SpO2 99 %     Weight 176 lb (79.8 kg)     Height 5\' 6"  (1.676 m)     Head Circumference      Peak Flow      Pain Score 10    Constitutional: Alert and oriented. Well appearing and in no distress. Eyes: Conjunctivae are normal.  ENT   Head: Normocephalic and atraumatic.   Nose: No  congestion/rhinnorhea.   Mouth/Throat: Mucous membranes are moist.   Neck: No stridor. Hematological/Lymphatic/Immunilogical: No cervical lymphadenopathy. Cardiovascular: Normal rate, regular rhythm.  No murmurs, rubs, or gallops.  Respiratory: Normal respiratory effort without tachypnea nor retractions. Breath sounds are clear and equal bilaterally. No wheezes/rales/rhonchi. Gastrointestinal: Soft and tender in the right lower quadrant. Incision sites c/d/i. No surrounding erythema. No rebound. No guarding.  Genitourinary: Deferred Musculoskeletal: Normal range of motion in all extremities. No lower extremity edema. Neurologic:  Normal speech and language. No gross focal neurologic deficits are appreciated.  Skin:  Skin is warm, dry and intact. No rash noted. Psychiatric: Mood and affect are normal. Speech and behavior are normal. Patient exhibits appropriate insight and judgment.  ____________________________________________    LABS (pertinent positives/negatives)  Labs Reviewed  COMPREHENSIVE METABOLIC PANEL - Abnormal; Notable for the following:       Result Value   CO2 19 (*)    Glucose, Bld 166 (*)    BUN 23 (*)    Creatinine, Ser 1.35 (*)    Total Protein 8.7 (*)    Albumin 5.4 (*)    Total Bilirubin 1.4 (*)    GFR calc non Af Amer 52 (*)    GFR calc Af Amer 60 (*)    All other components within normal limits  CBC - Abnormal; Notable for the following:    WBC 22.6 (*)    RBC 5.46 (*)    Hemoglobin 16.6 (*)    HCT 48.2 (*)    All other components within normal limits  LACTIC ACID, PLASMA - Abnormal; Notable for the following:    Lactic Acid, Venous 4.0 (*)    All other components within normal limits  URINALYSIS, COMPLETE (UACMP) WITH MICROSCOPIC - Abnormal; Notable for the following:    Color, Urine YELLOW (*)    APPearance HAZY (*)    Hgb urine dipstick SMALL (*)    Protein, ur 30 (*)    Bacteria, UA RARE (*)    Squamous Epithelial / LPF 0-5 (*)    All  other components within normal limits  CULTURE, BLOOD (ROUTINE X 2)  CULTURE, BLOOD (ROUTINE X 2)  LIPASE, BLOOD  LACTIC ACID, PLASMA  POCT PREGNANCY, URINE    ____________________________________________   EKG  I, Phineas SemenGraydon Crimson Dubberly, attending physician, personally viewed and interpreted this EKG  EKG Time: 1722 Rate: 105 Rhythm: sinus tachycardia Axis: normal Intervals: qtc 420 QRS: narrow ST changes: no st elevation Impression: sinus tachycardia otherwise normal ekg   ____________________________________________    RADIOLOGY  CXR  IMPRESSION: No active cardiopulmonary disease.  CT abd/pel  IMPRESSION: No acute abnormality of the abdomen or pelvis.   ____________________________________________   PROCEDURES  Procedures  CRITICAL CARE Performed by: Phineas SemenGOODMAN, Floyed Masoud   Total critical care time: 40 minutes  Critical care time was exclusive of separately billable procedures and treating other patients.  Critical care was necessary to treat or prevent imminent or life-threatening deterioration.  Critical care was time spent personally  by me on the following activities: development of treatment plan with patient and/or surrogate as well as nursing, discussions with consultants, evaluation of patient's response to treatment, examination of patient, obtaining history from patient or surrogate, ordering and performing treatments and interventions, ordering and review of laboratory studies, ordering and review of radiographic studies, pulse oximetry and re-evaluation of patient's condition.  ____________________________________________   INITIAL IMPRESSION / ASSESSMENT AND PLAN / ED COURSE  Pertinent labs & imaging results that were available during my care of the patient were reviewed by me and considered in my medical decision making (see chart for details).  Patient presented to the emergency department today because of concerns for right lower quadrant pain  and fever. This started this morning. Patient had an operation by OB/GYN roughly 10 days ago. On exam here patient did appear uncomfortable. She was tachycardic. Patient was found to have a significant leukocytosis and elevation of lactic acid. She was called a code sepsis. Patient was written for multiple broad-spectrum antibiotics. Her heart rate did improve with IV fluids. A CT scan was performed which did not show any clear intra-abdominal source of the infection. Additionally urine and chest x-ray without any obvious infection. Discussed both with OB/GYN and hospitalist. This point no clear post op complication. Patient will be admitted to the medical service  ____________________________________________   FINAL CLINICAL IMPRESSION(S) / ED DIAGNOSES  Final diagnoses:  Sepsis, due to unspecified organism [facility])     Note: This dictation was prepared with Dragon dictation. Any transcriptional errors that result from this process are unintentional     Phineas Semen, MD 08/18/16 2252

## 2016-08-18 NOTE — ED Notes (Signed)
Lactic acid level reported to Dr Derrill KayGoodman - no new orders at this time

## 2016-08-18 NOTE — ED Triage Notes (Signed)
Pt sent by Dr. Jolyn NapWard's office for fluids and CT

## 2016-08-18 NOTE — ED Triage Notes (Signed)
Pt via pov from home; sent by Dr. Jolyn NapWard's office. Pt has ovary and fallopian tube removed on May 8. She began vomiting and having diarrhea this morning at 3AM. Pt states she has lower right abdominal pain and is very weak. Pt alert & oriented; sleepy. NAD Noted.

## 2016-08-18 NOTE — Progress Notes (Signed)
ANTIBIOTIC CONSULT NOTE - INITIAL  Pharmacy Consult for Zosyn  Indication: Intra-abdominal infection   Allergies  Allergen Reactions  . Prednisone     Patient Measurements: Height: 5\' 6"  (167.6 cm) Weight: 176 lb (79.8 kg) IBW/kg (Calculated) : 59.3 Adjusted Body Weight:   Vital Signs: Temp: 99.6 F (37.6 C) (05/24 1551) Temp Source: Oral (05/24 1551) BP: 103/81 (05/24 1551) Pulse Rate: 120 (05/24 1551) Intake/Output from previous day: No intake/output data recorded. Intake/Output from this shift: Total I/O In: 1000 [IV Piggyback:1000] Out: -   Labs:  Recent Labs  08/18/16 1552  WBC 22.6*  HGB 16.6*  PLT 329  CREATININE 1.35*   Estimated Creatinine Clearance: 64.3 mL/min (A) (by C-G formula based on SCr of 1.35 mg/dL (H)). No results for input(s): VANCOTROUGH, VANCOPEAK, VANCORANDOM, GENTTROUGH, GENTPEAK, GENTRANDOM, TOBRATROUGH, TOBRAPEAK, TOBRARND, AMIKACINPEAK, AMIKACINTROU, AMIKACIN in the last 72 hours.   Microbiology: No results found for this or any previous visit (from the past 720 hour(s)).  Medical History: Past Medical History:  Diagnosis Date  . Endometriosis     Medications:   (Not in a hospital admission) Scheduled:   Assessment: Pharmacy consulted to dose and monitor Zosyn   Goal of Therapy:    Plan:  Will start Zosyn 3.375 g IV q8 hours.   Deshana Rominger D 08/18/2016,5:29 PM

## 2016-08-18 NOTE — ED Notes (Signed)
Pt reports that she was seen 2 weeks ago for pelvic pain in the er and f/u in the office - she was seen again on mother's day and was admitted and had left ovary and left tube removed - pt woke up this am and started with a fever and vomiting with diarrhea (vomited 7-8 times and 15 loose stools) - pt reports abd pain from right lower abd to the belly button - she also states she is having lower back pain

## 2016-08-18 NOTE — ED Notes (Signed)
1st set of blood cultures collected at triage 

## 2016-08-18 NOTE — ED Notes (Signed)
CODE  SEPSIS  CALLED  TO  CARELINK 

## 2016-08-19 LAB — C DIFFICILE QUICK SCREEN W PCR REFLEX
C DIFFICILE (CDIFF) INTERP: NOT DETECTED
C DIFFICILE (CDIFF) TOXIN: NEGATIVE
C DIFFICLE (CDIFF) ANTIGEN: NEGATIVE

## 2016-08-19 LAB — GASTROINTESTINAL PANEL BY PCR, STOOL (REPLACES STOOL CULTURE)
ADENOVIRUS F40/41: NOT DETECTED
ASTROVIRUS: NOT DETECTED
CAMPYLOBACTER SPECIES: NOT DETECTED
CRYPTOSPORIDIUM: NOT DETECTED
Cyclospora cayetanensis: NOT DETECTED
E. coli O157: NOT DETECTED
ENTEROPATHOGENIC E COLI (EPEC): NOT DETECTED
ENTEROTOXIGENIC E COLI (ETEC): NOT DETECTED
Entamoeba histolytica: NOT DETECTED
Enteroaggregative E coli (EAEC): NOT DETECTED
GIARDIA LAMBLIA: NOT DETECTED
NOROVIRUS GI/GII: DETECTED — AB
PLESIMONAS SHIGELLOIDES: NOT DETECTED
ROTAVIRUS A: NOT DETECTED
SHIGA LIKE TOXIN PRODUCING E COLI (STEC): NOT DETECTED
Salmonella species: NOT DETECTED
Sapovirus (I, II, IV, and V): DETECTED — AB
Shigella/Enteroinvasive E coli (EIEC): NOT DETECTED
Vibrio cholerae: NOT DETECTED
Vibrio species: NOT DETECTED
YERSINIA ENTEROCOLITICA: NOT DETECTED

## 2016-08-19 LAB — CBC
HCT: 38 % (ref 35.0–47.0)
Hemoglobin: 13.3 g/dL (ref 12.0–16.0)
MCH: 31 pg (ref 26.0–34.0)
MCHC: 35 g/dL (ref 32.0–36.0)
MCV: 88.7 fL (ref 80.0–100.0)
PLATELETS: 232 10*3/uL (ref 150–440)
RBC: 4.28 MIL/uL (ref 3.80–5.20)
RDW: 13.1 % (ref 11.5–14.5)
WBC: 11.3 10*3/uL — ABNORMAL HIGH (ref 3.6–11.0)

## 2016-08-19 LAB — BASIC METABOLIC PANEL
ANION GAP: 7 (ref 5–15)
BUN: 13 mg/dL (ref 6–20)
CALCIUM: 7.8 mg/dL — AB (ref 8.9–10.3)
CO2: 22 mmol/L (ref 22–32)
Chloride: 109 mmol/L (ref 101–111)
Creatinine, Ser: 1.05 mg/dL — ABNORMAL HIGH (ref 0.44–1.00)
GFR calc non Af Amer: >60 mL/min (ref >60)
Glucose, Bld: 104 mg/dL — ABNORMAL HIGH (ref 65–99)
Potassium: 3.1 mmol/L — ABNORMAL LOW (ref 3.5–5.1)
Sodium: 138 mmol/L (ref 135–145)

## 2016-08-19 LAB — MAGNESIUM: MAGNESIUM: 1.6 mg/dL — AB (ref 1.7–2.4)

## 2016-08-19 MED ORDER — LOPERAMIDE HCL 2 MG PO CAPS: 2.0000 mg | ORAL_CAPSULE | Freq: Four times a day (QID) | ORAL | Status: DC | PRN

## 2016-08-19 MED ORDER — POTASSIUM CHLORIDE CRYS ER 20 MEQ PO TBCR
20.0000 meq | EXTENDED_RELEASE_TABLET | Freq: Two times a day (BID) | ORAL | Status: DC
  Administered 2016-08-19: 20 meq via ORAL
  Filled 2016-08-19: qty 1

## 2016-08-19 NOTE — Discharge Summary (Signed)
[facility]   PATIENT NAME: Angela Clark    MR#:  [id number]  DATE OF BIRTH:  07/26/1985  DATE OF ADMISSION:  08/18/2016 ADMITTING PHYSICIAN: Milagros LollSrikar Sudini, MD  DATE OF DISCHARGE: 08/19/2016  PRIMARY CARE PHYSICIAN: Jerl MinaHedrick, James, MD    ADMISSION DIAGNOSIS:  Sepsis, due to unspecified organism (HCC) [A41.9]  DISCHARGE DIAGNOSIS:  Active Problems:   Sepsis (HCC)   SECONDARY DIAGNOSIS:   Past Medical History:  Diagnosis Date  . Endometriosis     HOSPITAL COURSE:   31 year old female with past medical history of endometriosis, hypothyroidism, portion, anxiety, history of migraines who presented to the hospital due to nausea vomiting and diarrhea.  1. Acute gastroenteritis-this was the cause of patient's nausea vomiting and diarrhea. This was not a postop complication. -Patient was treated supportively with IV fluids, antibiotics, antidiarrheals with Imodium. Her stool culture was positive for Norovirus.  - Her C. difficile PCR was negative. With supportive care her clinical symptoms have improved. She does not have any further nausea vomiting. Her diarrhea has improved with some Imodium. She is being discharged home with supportive care with follow-up with her primary care physician.  2. Anxiety/depression-patient will continue her Xanax, Lexapro.  3. Hypothyroidism-patient will resume her Synthroid.  4. History of migraines-patient will continue her Topamax, sumatriptan.  DISCHARGE CONDITIONS:   Stable  CONSULTS OBTAINED:    DRUG ALLERGIES:   Allergies  Allergen Reactions  . Prednisone     DISCHARGE MEDICATIONS:   Allergies as of 08/19/2016      Reactions   Prednisone       Medication List    STOP taking these medications   oxyCODONE 5 MG immediate release tablet Commonly known as:  Oxy IR/ROXICODONE     TAKE these medications   acetaminophen 500 MG tablet Commonly known as:  TYLENOL Take 2 tablets (1,000 mg  total) by mouth every 6 (six) hours.   ALPRAZolam 0.25 MG tablet Commonly known as:  XANAX Take 1 tablet by mouth daily as needed.   escitalopram 10 MG tablet Commonly known as:  LEXAPRO Take 1 tablet by mouth daily.   HYDROcodone-acetaminophen 5-325 MG tablet Commonly known as:  NORCO/VICODIN Take 1 tablet by mouth every 6 (six) hours as needed.   ibuprofen 600 MG tablet Commonly known as:  ADVIL,MOTRIN Take 1 tablet (600 mg total) by mouth every 6 (six) hours.   levothyroxine 25 MCG tablet Commonly known as:  SYNTHROID, LEVOTHROID Take 1 tablet by mouth daily.   SUMAtriptan 50 MG tablet Commonly known as:  IMITREX Take 1 tablet by mouth daily as needed.   topiramate 100 MG tablet Commonly known as:  TOPAMAX Take 1 tablet by mouth daily.         DISCHARGE INSTRUCTIONS:   DIET:  Regular diet  DISCHARGE CONDITION:  Stable  ACTIVITY:  Activity as tolerated  OXYGEN:  Home Oxygen: No.   Oxygen Delivery: room air  DISCHARGE LOCATION:  home   If you experience worsening of your admission symptoms, develop shortness of breath, life threatening emergency, suicidal or homicidal thoughts you must seek medical attention immediately by calling 911 or calling your MD immediately  if symptoms less severe.  You Must read complete instructions/literature along with all the possible adverse reactions/side effects for all the Medicines you take and that have been prescribed to you. Take any new Medicines after you have completely understood and accpet all the possible adverse reactions/side effects.   Please note  You were cared for by a hospitalist during your hospital stay. If you have any questions about your discharge medications or the care you received while you were in the hospital after you are discharged, you can call the unit and asked to speak with the hospitalist on call if the hospitalist that took care of you is not available. Once you are discharged, your  primary care physician will handle any further medical issues. Please note that NO REFILLS for any discharge medications will be authorized once you are discharged, as it is imperative that you return to your primary care physician (or establish a relationship with a primary care physician if you do not have one) for your aftercare needs so that they can reassess your need for medications and monitor your lab values.     Today   No further N/V. Diarrhea has improved.  Will d/c home today.   VITAL SIGNS:  Blood pressure 102/66, pulse 77, temperature 98.3 F (36.8 C), temperature source Oral, resp. rate 18, height 5\' 6"  (1.676 m), weight 83.4 kg (183 lb 14.4 oz), last menstrual period 07/27/2016, SpO2 100 %.  I/O:   Intake/Output Summary (Last 24 hours) at 08/19/16 1435 Last data filed at 08/19/16 0900  Gross per 24 hour  Intake             2888 ml  Output              650 ml  Net             2238 ml    PHYSICAL EXAMINATION:  GENERAL:  31 y.o.-year-old patient lying in the bed with no acute distress.  EYES: Pupils equal, round, reactive to light and accommodation. No scleral icterus. Extraocular muscles intact.  HEENT: Head atraumatic, normocephalic. Oropharynx and nasopharynx clear.  NECK:  Supple, no jugular venous distention. No thyroid enlargement, no tenderness.  LUNGS: Normal breath sounds bilaterally, no wheezing, rales,rhonchi. No use of accessory muscles of respiration.  CARDIOVASCULAR: S1, S2 normal. No murmurs, rubs, or gallops.  ABDOMEN: Soft, non-tender, non-distended. Bowel sounds present. No organomegaly or mass.  EXTREMITIES: No pedal edema, cyanosis, or clubbing.  NEUROLOGIC: Cranial nerves II through XII are intact. No focal motor or sensory defecits b/l.  PSYCHIATRIC: The patient is alert and oriented x 3. Good affect.  SKIN: No obvious rash, lesion, or ulcer.   DATA REVIEW:   CBC  Recent Labs Lab 08/19/16 0409  WBC 11.3*  HGB 13.3  HCT 38.0  PLT 232     Chemistries   Recent Labs Lab 08/18/16 1552 08/19/16 0409  NA 140 138  K 3.8 3.1*  CL 106 109  CO2 19* 22  GLUCOSE 166* 104*  BUN 23* 13  CREATININE 1.35* 1.05*  CALCIUM 10.0 7.8*  MG  --  1.6*  AST 37  --   ALT 46  --   ALKPHOS 96  --   BILITOT 1.4*  --     Cardiac Enzymes No results for input(s): TROPONINI in the last 168 hours.  Microbiology Results  Results for orders placed or performed during the hospital encounter of 08/18/16  Blood culture (routine x 2)     Status: None (Preliminary result)   Collection Time: 08/18/16  4:02 PM  Result Value Ref Range Status   Specimen Description BLOOD RIGHT [facility]  Final   Special Requests   Final    BOTTLES DRAWN AEROBIC AND ANAEROBIC Blood Culture results may not be optimal due to an excessive  volume of blood received in culture bottles   Culture NO GROWTH < 24 HOURS  Final   Report Status PENDING  Incomplete  Blood culture (routine x 2)     Status: None (Preliminary result)   Collection Time: 08/18/16  5:01 PM  Result Value Ref Range Status   Specimen Description BLOOD L AC  Final   Special Requests BOTTLES DRAWN AEROBIC AND ANAEROBIC BCAV  Final   Culture NO GROWTH < 24 HOURS  Final   Report Status PENDING  Incomplete  C difficile quick scan w PCR reflex     Status: None   Collection Time: 08/19/16  4:50 AM  Result Value Ref Range Status   C Diff antigen NEGATIVE NEGATIVE Final   C Diff toxin NEGATIVE NEGATIVE Final   C Diff interpretation No C. difficile detected.  Final    Comment: VALID  Gastrointestinal Panel by PCR , Stool     Status: Abnormal   Collection Time: 08/19/16  4:50 AM  Result Value Ref Range Status   Campylobacter species NOT DETECTED NOT DETECTED Final   Plesimonas shigelloides NOT DETECTED NOT DETECTED Final   Salmonella species NOT DETECTED NOT DETECTED Final   Yersinia enterocolitica NOT DETECTED NOT DETECTED Final   Vibrio species NOT DETECTED NOT DETECTED Final   Vibrio cholerae NOT  DETECTED NOT DETECTED Final   Enteroaggregative E coli (EAEC) NOT DETECTED NOT DETECTED Final   Enteropathogenic E coli (EPEC) NOT DETECTED NOT DETECTED Final   Enterotoxigenic E coli (ETEC) NOT DETECTED NOT DETECTED Final   Shiga like toxin producing E coli (STEC) NOT DETECTED NOT DETECTED Final   E. coli O157 NOT DETECTED NOT DETECTED Final   Shigella/Enteroinvasive E coli (EIEC) NOT DETECTED NOT DETECTED Final   Cryptosporidium NOT DETECTED NOT DETECTED Final   Cyclospora cayetanensis NOT DETECTED NOT DETECTED Final   Entamoeba histolytica NOT DETECTED NOT DETECTED Final   Giardia lamblia NOT DETECTED NOT DETECTED Final   Adenovirus F40/41 NOT DETECTED NOT DETECTED Final   Astrovirus NOT DETECTED NOT DETECTED Final   Norovirus GI/GII DETECTED (A) NOT DETECTED Final    Comment: RESULT CALLED TO, READ BACK BY AND VERIFIED WITH: KAREN SMITH 08/19/16 0750 SJL    Rotavirus A NOT DETECTED NOT DETECTED Final   Sapovirus (I, II, IV, and V) DETECTED (A) NOT DETECTED Final    RADIOLOGY:  Dg Chest 2 View  Result Date: 08/18/2016 CLINICAL DATA:  31 y/o  F; fever. EXAM: CHEST  2 VIEW COMPARISON:  08/27/2015 chest radiograph. FINDINGS: Stable heart size and mediastinal contours are within normal limits. Both lungs are clear. The visualized skeletal structures are unremarkable. IMPRESSION: No active cardiopulmonary disease. Electronically Signed   By: Mitzi Hansen M.D.   On: 08/18/2016 19:13   Ct Abdomen Pelvis W Contrast  Result Date: 08/18/2016 CLINICAL DATA:  Abdominal pain and vomiting.  Diarrhea. EXAM: CT ABDOMEN AND PELVIS WITH CONTRAST TECHNIQUE: Multidetector CT imaging of the abdomen and pelvis was performed using the standard protocol following bolus administration of intravenous contrast. CONTRAST:  ISOVUE-300 IOPAMIDOL (ISOVUE-300) INJECTION 61% COMPARISON:  CT abdomen pelvis 08/02/2016 FINDINGS: Lower chest: No pulmonary nodules. No visible pleural or pericardial  effusion. Hepatobiliary: Normal hepatic size and contours without focal liver lesion. No perihepatic ascites. No intra- or extrahepatic biliary dilatation. Normal gallbladder. Pancreas: Normal pancreatic contours and enhancement. No peripancreatic fluid collection or pancreatic ductal dilatation. Spleen: Normal. Adrenals/Urinary Tract: Normal adrenal glands. No hydronephrosis or solid renal mass. Stomach/Bowel: There is no  hiatal hernia. The stomach and duodenum are normal. There is no dilated small bowel or enteric inflammation. There is no colonic abnormality. The appendix is normal. Vascular/Lymphatic: Normal course and caliber of the major abdominal vessels. No abdominal or pelvic adenopathy. Reproductive: The uterus is normal. The right ovary is normal. Status post left salpingo-oophorectomy. No free fluid in the pelvis. Musculoskeletal: No lytic or blastic osseous lesion. Normal visualized extrathoracic and extraperitoneal soft tissues. Other: No contributory non-categorized findings. IMPRESSION: No acute abnormality of the abdomen or pelvis. Electronically Signed   By: Deatra Robinson M.D.   On: 08/18/2016 18:22      Management plans discussed with the patient, family and they are in agreement.  CODE STATUS:     Code Status Orders        Start     Ordered   08/18/16 1953  Full code  Continuous     08/18/16 1953    Code Status History    Date Active Date Inactive Code Status Order ID Comments User Context   08/07/2016  7:33 PM 08/08/2016  4:57 PM Full Code [id number]  Ward, Elenora Fender, MD ED      TOTAL TIME TAKING CARE OF THIS PATIENT: 40 minutes.    Houston Siren M.D on 08/19/2016 at 2:35 PM  Between 7am to 6pm - Pager - [phone]  After 6pm go to [email or web address] - Social research officer, government  [facility]  Office  [phone]  CC: Primary care physician; Jerl Mina, MD

## 2016-08-19 NOTE — Progress Notes (Signed)
Initial Nutrition Assessment  DOCUMENTATION CODES:   Not applicable  INTERVENTION:  Will add gluten-free restriction to diet per patient request. Discussed that it will limit her options, but she is requesting this. Of note she has ordered pancakes and chicken noodle soup on her own today, both containing gluten.  Encouraged adequate intake of calories and protein at meals to prevent weight loss.  No further nutrition interventions warranted at this time.  NUTRITION DIAGNOSIS:   Inadequate oral intake related to poor appetite, nausea, vomiting, other (see comment) (diarrhea) as evidenced by per patient/family report.  GOAL:   Patient will meet greater than or equal to 90% of their needs  MONITOR:   PO intake, Labs, Weight trends, I & O's  REASON FOR ASSESSMENT:   Malnutrition Screening Tool    ASSESSMENT:   31 year old female with PMHx of Hashimoto's, endometriosis, recent laparoscopic left salpingo oophorectomy on 5/14 presents with right flank pain, vomiting, watery diarrhea found to have gastroenteritis and meets criteria for sepsis.    Spoke with patient at bedside. She reports she has had a poor appetite for 1-2 days. She was only eating bites of meals. Appetite already improving and she was able to eat pancakes for breakfast this morning. Patient reports she follows a gluten-free diet. No diagnosis of Celiac disease but reports GI upset with gluten intake. Discussed with patient that the meal she ordered this morning had gluten and she reports she is aware of this. Requesting diet order be updated to reflect gluten-free. Patient feels she will be able to eat back to normal in a few days and does not need any intervention.  UBW 186 lbs. Per chart patient has lost 2.4 lbs (1.3% body weight) over 2 weeks, which is not significant for time frame.  Meal Completion: 75%  Medications reviewed and include: levothyroxine, potassium chloride 20 mEq BID, NS with KCl 20 mEq/L @ 100  ml/hr.  Labs reviewed: Potassium 3.1, Creatinine 1.05, Magnesium 1.6. Negative for C. Diff.  Nutrition-Focused physical exam completed. Findings are no fat depletion, no muscle depletion, and no edema.   Patient does not meet criteria for malnutrition at this time.  Discussed with RN.  Diet Order:  DIET SOFT Room service appropriate? Yes; Fluid consistency: Thin  Skin:  Reviewed, no issues  Last BM:  08/19/2016 - type 7 per chart  Height:   Ht Readings from Last 1 Encounters:  08/18/16 5\' 6"  (1.676 m)    Weight:   Wt Readings from Last 1 Encounters:  08/19/16 183 lb 14.4 oz (83.4 kg)    Ideal Body Weight:  59.1 kg  BMI:  Body mass index is 29.68 kg/m.  Estimated Nutritional Needs:   Kcal:  1880-2195 (MSJ x 1.2-1.4)  Protein:  85-100 grams (1-1.2 grams/kg)  Fluid:  1.8-2.2 L/day  EDUCATION NEEDS:   No education needs identified at this time  Helane RimaLeanne Sophea Rackham, MS, RD, LDN Pager: [phone] After Hours Pager: [phone]

## 2016-08-19 NOTE — Progress Notes (Signed)
Patient discharged to home as ordered. Discharge instructions given as ordered. IV discontinued site clean dry and intact. Patient is alert and oriented, ambulates well, denies pain at this time. Patient husband at the bedside to take patient home.

## 2016-08-20 LAB — HIV ANTIBODY (ROUTINE TESTING W REFLEX): HIV Screen 4th Generation wRfx: NONREACTIVE

## 2016-08-23 LAB — CULTURE, BLOOD (ROUTINE X 2)
CULTURE: NO GROWTH
Culture: NO GROWTH

## 2016-12-14 LAB — OB RESULTS CONSOLE HIV ANTIBODY (ROUTINE TESTING): HIV: NONREACTIVE

## 2016-12-14 LAB — OB RESULTS CONSOLE VARICELLA ZOSTER ANTIBODY, IGG: Varicella: IMMUNE

## 2016-12-14 LAB — OB RESULTS CONSOLE RUBELLA ANTIBODY, IGM: Rubella: IMMUNE

## 2016-12-14 LAB — OB RESULTS CONSOLE RPR: RPR: NONREACTIVE

## 2016-12-14 LAB — OB RESULTS CONSOLE HEPATITIS B SURFACE ANTIGEN: HEP B S AG: NEGATIVE

## 2017-03-28 NOTE — L&D Delivery Note (Signed)
Delivery Note  First Stage: Labor onset: 1430 Induction: Cytotec x 1 dose, Cook cath, Pitocin, AROM Analgesia /Anesthesia intrapartum: Fentanyl x 2 doses, Epidural AROM at 2113  Second Stage: Complete dilation at 0018 Onset of pushing at 0024 FHR second stage Cat I  Delivery of a viable female 06/19/2017 at  0028 by R Kathrina Crosley CNM delivery of fetal head in LOA position with restitution to LOT. No nuchal cord;  Anterior then posterior shoulders delivered easily with gentle downward traction. Baby placed on mom's chest, and attended to by peds.  Cord double clamped after cessation of pulsation, cut by FOB Cord blood sample collected    Third Stage: Placenta delivered spontaneously intact with 3VC @ 0043 Placenta disposition: routine disposal Uterine tone firm / bleeding: initially scant, then brisk bleeding with several large clots expressed with fundal massage. LUS noted to be boggy with multiple clots, evacuated clots. Cytotec 800mcg PR placed by CNM.   No lacerations identified   Est. Blood Loss (mL): 350  Complications: none  Mom to postpartum.  Baby to Couplet care / Skin to Skin.  Newborn: Birth Weight: 6#9.5oz  Apgar Scores: 8/9 Feeding planned: breast

## 2017-06-06 LAB — OB RESULTS CONSOLE GC/CHLAMYDIA
CHLAMYDIA, DNA PROBE: NEGATIVE
Gonorrhea: NEGATIVE

## 2017-06-06 LAB — OB RESULTS CONSOLE GBS: STREP GROUP B AG: NEGATIVE

## 2017-06-06 LAB — OB RESULTS CONSOLE HIV ANTIBODY (ROUTINE TESTING): HIV: NONREACTIVE

## 2017-06-09 ENCOUNTER — Other Ambulatory Visit: Payer: Self-pay | Admitting: Obstetrics & Gynecology

## 2017-06-09 ENCOUNTER — Inpatient Hospital Stay
Admission: RE | Admit: 2017-06-09 | Discharge: 2017-06-09 | Disposition: A | Discharge status: Home / Self Care | Payer: 59 | Source: Ambulatory Visit | Attending: Obstetrics & Gynecology | Admitting: Obstetrics & Gynecology

## 2017-06-09 DIAGNOSIS — Z3A00 Weeks of gestation of pregnancy not specified: Secondary | ICD-10-CM | POA: Diagnosis not present

## 2017-06-09 DIAGNOSIS — O139 Gestational [pregnancy-induced] hypertension without significant proteinuria, unspecified trimester: Secondary | ICD-10-CM | POA: Diagnosis present

## 2017-06-09 DIAGNOSIS — O09899 Supervision of other high risk pregnancies, unspecified trimester: Secondary | ICD-10-CM | POA: Insufficient documentation

## 2017-06-09 MED ORDER — BETAMETHASONE SOD PHOS & ACET 6 (3-3) MG/ML IJ SUSP
12.0000 mg | INTRAMUSCULAR | Status: DC
  Administered 2017-06-09: 12 mg via INTRAMUSCULAR

## 2017-06-09 MED ORDER — BETAMETHASONE SOD PHOS & ACET 6 (3-3) MG/ML IJ SUSP: 12.0000 mg | INTRAMUSCULAR | Status: DC

## 2017-06-10 ENCOUNTER — Inpatient Hospital Stay
Admission: RE | Admit: 2017-06-10 | Discharge: 2017-06-10 | Disposition: A | Discharge status: Home / Self Care | Payer: 59 | Attending: Obstetrics and Gynecology | Admitting: Obstetrics and Gynecology

## 2017-06-10 DIAGNOSIS — Z3A35 35 weeks gestation of pregnancy: Secondary | ICD-10-CM | POA: Diagnosis not present

## 2017-06-10 MED ORDER — BETAMETHASONE SOD PHOS & ACET 6 (3-3) MG/ML IJ SUSP
12.0000 mg | Freq: Once | INTRAMUSCULAR | Status: AC
  Administered 2017-06-10: 12 mg via INTRAMUSCULAR

## 2017-06-13 ENCOUNTER — Other Ambulatory Visit: Payer: Self-pay | Admitting: Obstetrics and Gynecology

## 2017-06-13 DIAGNOSIS — O133 Gestational [pregnancy-induced] hypertension without significant proteinuria, third trimester: Secondary | ICD-10-CM

## 2017-06-18 ENCOUNTER — Other Ambulatory Visit: Payer: Self-pay

## 2017-06-18 ENCOUNTER — Inpatient Hospital Stay
Admission: AD | Admit: 2017-06-18 | Discharge: 2017-06-20 | DRG: 807 | Disposition: A | Discharge status: Home / Self Care | Payer: 59 | Source: Ambulatory Visit | Attending: Obstetrics and Gynecology | Admitting: Obstetrics and Gynecology

## 2017-06-18 ENCOUNTER — Inpatient Hospital Stay: Payer: 59 | Admitting: Anesthesiology

## 2017-06-18 DIAGNOSIS — O139 Gestational [pregnancy-induced] hypertension without significant proteinuria, unspecified trimester: Secondary | ICD-10-CM | POA: Diagnosis present

## 2017-06-18 DIAGNOSIS — F419 Anxiety disorder, unspecified: Secondary | ICD-10-CM | POA: Diagnosis present

## 2017-06-18 DIAGNOSIS — O134 Gestational [pregnancy-induced] hypertension without significant proteinuria, complicating childbirth: Principal | ICD-10-CM | POA: Diagnosis present

## 2017-06-18 DIAGNOSIS — O99284 Endocrine, nutritional and metabolic diseases complicating childbirth: Secondary | ICD-10-CM | POA: Diagnosis present

## 2017-06-18 DIAGNOSIS — E079 Disorder of thyroid, unspecified: Secondary | ICD-10-CM | POA: Diagnosis present

## 2017-06-18 DIAGNOSIS — Z3A37 37 weeks gestation of pregnancy: Secondary | ICD-10-CM

## 2017-06-18 DIAGNOSIS — O99344 Other mental disorders complicating childbirth: Secondary | ICD-10-CM | POA: Diagnosis present

## 2017-06-18 HISTORY — DX: Disorder of thyroid, unspecified: E07.9

## 2017-06-18 LAB — COMPREHENSIVE METABOLIC PANEL
ALBUMIN: 3.2 g/dL — AB (ref 3.5–5.0)
ALK PHOS: 121 U/L (ref 38–126)
ALT: 12 U/L — AB (ref 14–54)
AST: 22 U/L (ref 15–41)
Anion gap: 9 (ref 5–15)
BUN: 8 mg/dL (ref 6–20)
CO2: 19 mmol/L — ABNORMAL LOW (ref 22–32)
Calcium: 8.7 mg/dL — ABNORMAL LOW (ref 8.9–10.3)
Chloride: 105 mmol/L (ref 101–111)
Creatinine, Ser: 0.59 mg/dL (ref 0.44–1.00)
GFR calc non Af Amer: >60 mL/min (ref >60)
Glucose, Bld: 94 mg/dL (ref 65–99)
POTASSIUM: 3.5 mmol/L (ref 3.5–5.1)
SODIUM: 133 mmol/L — AB (ref 135–145)
TOTAL PROTEIN: 6.4 g/dL — AB (ref 6.5–8.1)
Total Bilirubin: 1 mg/dL (ref 0.3–1.2)

## 2017-06-18 LAB — CBC
HCT: 37.8 % (ref 35.0–47.0)
HEMOGLOBIN: 13.2 g/dL (ref 12.0–16.0)
MCH: 31.2 pg (ref 26.0–34.0)
MCHC: 35 g/dL (ref 32.0–36.0)
MCV: 89.1 fL (ref 80.0–100.0)
PLATELETS: 281 10*3/uL (ref 150–440)
RBC: 4.24 MIL/uL (ref 3.80–5.20)
RDW: 14 % (ref 11.5–14.5)
WBC: 17.1 10*3/uL — ABNORMAL HIGH (ref 3.6–11.0)

## 2017-06-18 LAB — TYPE AND SCREEN
ABO/RH(D): B POS
Antibody Screen: NEGATIVE

## 2017-06-18 LAB — PROTEIN / CREATININE RATIO, URINE
CREATININE, URINE: 44 mg/dL
Total Protein, Urine: <6 mg/dL

## 2017-06-18 MED ORDER — ONDANSETRON HCL 4 MG/2ML IJ SOLN: 4.0000 mg | Freq: Four times a day (QID) | INTRAMUSCULAR | Status: DC | PRN

## 2017-06-18 MED ORDER — LACTATED RINGERS IV SOLN: 500.0000 mL | INTRAVENOUS | Status: DC | PRN

## 2017-06-18 MED ORDER — FENTANYL 2.5 MCG/ML W/ROPIVACAINE 0.15% IN NS 100 ML EPIDURAL (ARMC)
12.0000 mL/h | EPIDURAL | Status: DC
  Administered 2017-06-18: 12 mL/h via EPIDURAL

## 2017-06-18 MED ORDER — PHENYLEPHRINE 40 MCG/ML (10ML) SYRINGE FOR IV PUSH (FOR BLOOD PRESSURE SUPPORT)
80.0000 ug | PREFILLED_SYRINGE | INTRAVENOUS | Status: DC | PRN
  Filled 2017-06-18: qty 5

## 2017-06-18 MED ORDER — FENTANYL 2.5 MCG/ML W/ROPIVACAINE 0.15% IN NS 100 ML EPIDURAL (ARMC)
EPIDURAL | Status: AC
  Filled 2017-06-18: qty 100

## 2017-06-18 MED ORDER — FENTANYL CITRATE (PF) 100 MCG/2ML IJ SOLN
50.0000 ug | INTRAMUSCULAR | Status: DC | PRN
  Administered 2017-06-18 (×2): 50 ug via INTRAVENOUS
  Filled 2017-06-18: qty 2

## 2017-06-18 MED ORDER — LIDOCAINE HCL (PF) 1 % IJ SOLN
INTRAMUSCULAR | Status: DC | PRN
  Administered 2017-06-18: 1 mL via INTRADERMAL

## 2017-06-18 MED ORDER — LACTATED RINGERS IV SOLN
INTRAVENOUS | Status: DC
  Administered 2017-06-18 (×3): via INTRAVENOUS

## 2017-06-18 MED ORDER — MISOPROSTOL 200 MCG PO TABS
ORAL_TABLET | ORAL | Status: AC
  Administered 2017-06-19: 800 ug
  Filled 2017-06-18: qty 4

## 2017-06-18 MED ORDER — TERBUTALINE SULFATE 1 MG/ML IJ SOLN: 0.2500 mg | Freq: Once | INTRAMUSCULAR | Status: DC | PRN

## 2017-06-18 MED ORDER — MISOPROSTOL 25 MCG QUARTER TABLET
50.0000 ug | ORAL_TABLET | ORAL | Status: DC
  Administered 2017-06-18: 50 ug via BUCCAL
  Filled 2017-06-18: qty 1

## 2017-06-18 MED ORDER — OXYTOCIN BOLUS FROM INFUSION
500.0000 mL | Freq: Once | INTRAVENOUS | Status: AC
  Administered 2017-06-19: 500 mL via INTRAVENOUS

## 2017-06-18 MED ORDER — DIPHENHYDRAMINE HCL 50 MG/ML IJ SOLN: 12.5000 mg | INTRAMUSCULAR | Status: DC | PRN

## 2017-06-18 MED ORDER — CALCIUM CARBONATE ANTACID 500 MG PO CHEW: 1.0000 | CHEWABLE_TABLET | Freq: Four times a day (QID) | ORAL | Status: DC | PRN

## 2017-06-18 MED ORDER — OXYTOCIN 40 UNITS IN LACTATED RINGERS INFUSION - SIMPLE MED
2.5000 [IU]/h | INTRAVENOUS | Status: DC
  Administered 2017-06-19 (×2): 2.5 [IU]/h via INTRAVENOUS
  Filled 2017-06-18: qty 1000

## 2017-06-18 MED ORDER — LIDOCAINE HCL (PF) 1 % IJ SOLN: 30.0000 mL | INTRAMUSCULAR | Status: DC | PRN

## 2017-06-18 MED ORDER — OXYTOCIN 10 UNIT/ML IJ SOLN
INTRAMUSCULAR | Status: AC
  Filled 2017-06-18: qty 2

## 2017-06-18 MED ORDER — EPHEDRINE 5 MG/ML INJ
10.0000 mg | INTRAVENOUS | Status: DC | PRN
  Filled 2017-06-18: qty 2

## 2017-06-18 MED ORDER — AMMONIA AROMATIC IN INHA
RESPIRATORY_TRACT | Status: AC
  Filled 2017-06-18: qty 10

## 2017-06-18 MED ORDER — LACTATED RINGERS IV SOLN: 500.0000 mL | Freq: Once | INTRAVENOUS | Status: DC

## 2017-06-18 MED ORDER — SOD CITRATE-CITRIC ACID 500-334 MG/5ML PO SOLN: 30.0000 mL | ORAL | Status: DC | PRN

## 2017-06-18 MED ORDER — OXYTOCIN 40 UNITS IN LACTATED RINGERS INFUSION - SIMPLE MED
1.0000 m[IU]/min | INTRAVENOUS | Status: DC
  Administered 2017-06-18: 1 m[IU]/min via INTRAVENOUS
  Filled 2017-06-18: qty 1000

## 2017-06-18 MED ORDER — LIDOCAINE HCL (PF) 1 % IJ SOLN
INTRAMUSCULAR | Status: AC
  Filled 2017-06-18: qty 30

## 2017-06-18 MED ORDER — ACETAMINOPHEN 325 MG PO TABS
650.0000 mg | ORAL_TABLET | ORAL | Status: DC | PRN
  Administered 2017-06-18: 650 mg via ORAL
  Filled 2017-06-18: qty 2

## 2017-06-18 MED ORDER — SODIUM CHLORIDE 0.9 % IV SOLN
INTRAVENOUS | Status: DC | PRN
  Administered 2017-06-18 (×3): 5 mL via EPIDURAL

## 2017-06-18 MED ORDER — FAMOTIDINE 20 MG PO TABS: 20.0000 mg | ORAL_TABLET | Freq: Every day | ORAL | Status: DC

## 2017-06-18 NOTE — Anesthesia Preprocedure Evaluation (Signed)
Anesthesia Evaluation  Patient identified by MRN, date of birth, ID band Patient awake    Reviewed: Allergy & Precautions, H&P , NPO status , Patient's Chart, lab work & pertinent test results  History of Anesthesia Complications Negative for: history of anesthetic complications  Airway Mallampati: III  TM Distance: <3 FB Neck ROM: full    Dental  (+) Chipped   Pulmonary asthma ,           Cardiovascular Exercise Tolerance: Good hypertension, negative cardio ROS       Neuro/Psych    GI/Hepatic negative GI ROS,   Endo/Other    Renal/GU   negative genitourinary   Musculoskeletal   Abdominal   Peds  Hematology negative hematology ROS (+)   Anesthesia Other Findings Past Medical History: No date: Endometriosis No date: Pregnancy induced hypertension No date: Thyroid disease  Past Surgical History: 08/07/2016: LAPAROSCOPIC BILATERAL SALPINGO OOPHERECTOMY; Left     Comment:  Procedure: LAPAROSCOPIC LEFT  SALPINGO OOPHORECTOMY;                Surgeon: Ward, Elenora Fenderhelsea C, MD;  Location: [facility] ORS;                Service: Gynecology;  Laterality: Left; 08/07/2016: LAPAROSCOPY; N/A     Comment:  Procedure: LAPAROSCOPY OPERATIVE;  Surgeon: Ward,               Elenora Fenderhelsea C, MD;  Location: [facility] ORS;  Service: Gynecology;              Laterality: N/A; No date: TONSILLECTOMY  BMI    Body Mass Index:  33.12 kg/m      Reproductive/Obstetrics (+) Pregnancy                             Anesthesia Physical Anesthesia Plan  ASA: III  Anesthesia Plan: Epidural   Post-op Pain Management:    Induction:   PONV Risk Score and Plan:   Airway Management Planned:   Additional Equipment:   Intra-op Plan:   Post-operative Plan:   Informed Consent: I have reviewed the patients History and Physical, chart, labs and discussed the procedure including the risks, benefits and alternatives for the  proposed anesthesia with the patient or authorized representative who has indicated his/her understanding and acceptance.     Plan Discussed with: Anesthesiologist  Anesthesia Plan Comments:         Anesthesia Quick Evaluation

## 2017-06-18 NOTE — OB Triage Note (Signed)
Patient came in for Scheduled Induction of Labor due to gestationall hypertension. Patient denies vaginal bleeding. Denies contractions at this time. No leakage of fluids. Positive fetal movement.

## 2017-06-18 NOTE — Anesthesia Procedure Notes (Signed)
Epidural Patient location during procedure: OB Start time: 06/18/2017 6:36 PM End time: 06/18/2017 6:42 PM  Staffing Anesthesiologist: Piscitello, Cleda MccreedyJoseph K, MD Performed: anesthesiologist   Preanesthetic Checklist Completed: patient identified, site marked, surgical consent, pre-op evaluation, timeout performed, IV checked, risks and benefits discussed and monitors and equipment checked  Epidural Patient position: sitting Prep: ChloraPrep Patient monitoring: heart rate, continuous pulse ox and blood pressure Approach: midline Location: L3-L4 Injection technique: LOR saline  Needle:  Needle type: Tuohy  Needle gauge: 17 G Needle length: 9 cm and 9 Needle insertion depth: 5.5 cm Catheter type: closed end flexible Catheter size: 19 Gauge Catheter at skin depth: 10.5 cm Test dose: negative and 1.5% lidocaine with Epi 1:200 K  Assessment Sensory level: T10 Events: blood not aspirated, injection not painful, no injection resistance, negative IV test and no paresthesia  Additional Notes 1 attempt Pt. Evaluated and documentation done after procedure finished. Patient identified. Risks/Benefits/Options discussed with patient including but not limited to bleeding, infection, nerve damage, paralysis, failed block, incomplete pain control, headache, blood pressure changes, nausea, vomiting, reactions to medication both or allergic, itching and postpartum back pain. Confirmed with bedside nurse the patient's most recent platelet count. Confirmed with patient that they are not currently taking any anticoagulation, have any bleeding history or any family history of bleeding disorders. Patient expressed understanding and wished to proceed. All questions were answered. Sterile technique was used throughout the entire procedure. Please see nursing notes for vital signs. Test dose was given through epidural catheter and negative prior to continuing to dose epidural or start infusion. Warning signs of  high block given to the patient including shortness of breath, tingling/numbness in hands, complete motor block, or any concerning symptoms with instructions to call for help. Patient was given instructions on fall risk and not to get out of bed. All questions and concerns addressed with instructions to call with any issues or inadequate analgesia.   Patient tolerated the insertion well without immediate complications.Reason for block:procedure for pain

## 2017-06-18 NOTE — Progress Notes (Signed)
Labor Progress Note  Angela BeetsLaura K Clark is a 32 y.o. G2P1001 at 4066w0d by Patient's last menstrual period was 07/27/2016 (exact date). Confirmed with US at 6043w3d.    [facility]   Subjective: feeling UC pain, 3-4/10, has to breathe through some of them.   Objective: BP 122/72   Pulse 86   Temp 98.2 F (36.8 C) (Oral)   Resp 18   Ht 5\' 5"  (1.651 m)   Wt 199 lb (90.3 kg)   LMP 07/27/2016 (Exact Date)   BMI 33.12 kg/m  Notable VS details: reviewed  Fetal Assessment: FHT:  FHR: 125 bpm, variability: moderate,  accelerations:  Present,  decelerations:  Absent Category/reactivity:  Category I UC:   regular, every 2-3 minutes SVE:   1-2/30/-2; soft/posterior  Cook catheter placed using stylet, pt tolerated well. 80ml sterile water uterine balloon, 80ml sterile water vaginal balloon, taped to leg with gentle traction.   Amniotic color: intact membranes  Labs: Lab Results  Component Value Date   WBC 17.1 (H) 06/18/2017   HGB 13.2 06/18/2017   HCT 37.8 06/18/2017   MCV 89.1 06/18/2017   PLT 281 06/18/2017    Assessment / Plan: G2P1 at 37+0wks, IOL for GHTN.  Labor: s/p 1 dose cytotec, contracting too much for repeat cytotec. Cook cath placed now, will start pitocin low dose.  Preeclampsia:  no signs or symptoms of toxicity and labs stable Fetal Wellbeing:  Category I Pain Control:  Labor support without medications; may have epidural on request. I/D:  n/a Anticipated MOD:  NSVD  Angela Clark A, CNM 06/18/2017, 1:43 PM

## 2017-06-18 NOTE — H&P (Signed)
OB History & Physical   History of Present Illness:  Chief Complaint: induction of labor  HPI:  Angela Clark is a 32 y.o. G2P1001 female at [redacted]w[redacted]d dated by Patient's last menstrual period was 07/27/2016 (exact date). Confirmed with [place] at [redacted]w[redacted]d.  She presents to L&D for IOL due to [facility]  Active FM, denies UCs, LOF or SROM, no bloody show. Denies VD or RUQ pain, has had headache off and on.    Pregnancy Issues: 1. GHTN 2. Anxiety; restarted Lexapro 10mg /day 03/15/17 3. Thyroid disease:Hashimoto's and takes Synthroid 50 mcg   Maternal Medical History:   Past Medical History:  Diagnosis Date  . Endometriosis   . Pregnancy induced hypertension   . Thyroid disease     Past Surgical History:  Procedure Laterality Date  . LAPAROSCOPIC BILATERAL SALPINGO OOPHERECTOMY Left 08/07/2016   Procedure: LAPAROSCOPIC LEFT  SALPINGO OOPHORECTOMY;  Surgeon: Ward, Elenora Fender, MD;  Location: [facility] ORS;  Service: Gynecology;  Laterality: Left;  . LAPAROSCOPY N/A 08/07/2016   Procedure: LAPAROSCOPY OPERATIVE;  Surgeon: Ward, Elenora Fender, MD;  Location: [facility] ORS;  Service: Gynecology;  Laterality: N/A;  . TONSILLECTOMY      Allergies  Allergen Reactions  . Prednisone Rash    "Caused a rash 15 years ago"    Prior to Admission medications   Medication Sig Start Date End Date Taking? Authorizing Provider  acetaminophen (TYLENOL) 500 MG tablet Take 2 tablets (1,000 mg total) by mouth every 6 (six) hours. 08/08/16  Yes Ward, Elenora Fender, MD  ALPRAZolam Prudy Feeler) 0.25 MG tablet Take 1 tablet by mouth daily as needed. 06/16/16  Yes [provider]  escitalopram (LEXAPRO) 10 MG tablet Take 1 tablet by mouth daily. 06/30/16  Yes [provider]  levothyroxine (SYNTHROID, LEVOTHROID) 25 MCG tablet Take 75 mcg by mouth daily.  06/30/16  Yes [provider]  HYDROcodone-acetaminophen (NORCO/VICODIN) 5-325 MG tablet Take 1 tablet by mouth every 6 (six) hours as needed. 08/03/16   [provider]  ibuprofen (ADVIL,MOTRIN) 600 MG tablet Take 1 tablet (600 mg total) by mouth every 6 (six) hours. Patient not taking: Reported on 06/18/2017 08/08/16   Ward, Elenora Fender, MD  SUMAtriptan (IMITREX) 50 MG tablet Take 1 tablet by mouth daily as needed.    [provider]  topiramate (TOPAMAX) 100 MG tablet Take 1 tablet by mouth daily. 05/18/16   [provider]     Prenatal care site: [facility]   Social History: She  reports that she has never smoked. She has never used smokeless tobacco. She reports that she drinks alcohol. She reports that she does not use drugs.  Family History: family history is not on file.   Review of Systems: A full review of systems was performed and negative except as noted in the HPI.     Physical Exam:  Vital Signs: BP 122/73   Pulse 86   Temp 98.2 F (36.8 C) (Oral)   Resp 18   Ht 5\' 5"  (1.651 m)   Wt 199 lb (90.3 kg)   LMP 07/27/2016 (Exact Date)   BMI 33.12 kg/m  General: no acute distress.  HEENT: normocephalic, atraumatic, eyes non-icteric Heart: regular rate & rhythm.  No murmurs/rubs/gallops Lungs: clear to auscultation bilaterally, normal respiratory effort Abdomen: soft, gravid, non-tender;  EFW: 7lbs Pelvic:   External: Normal external female genitalia  Cervix: Dilation: 1 / Effacement (%): 30 / Station: Ballotable    Extremities: non-tender, symmetric, Trace edema bilaterally.  DTRs:  2+  Neurologic: Alert & oriented x 3.    Results for orders placed or performed during the hospital encounter of 06/18/17 (from the past 24 hour(s))  CBC     Status: Abnormal   Collection Time: 06/18/17  7:02 AM  Result Value Ref Range   WBC 17.1 (H) 3.6 - 11.0 K/uL   RBC 4.24 3.80 - 5.20 MIL/uL   Hemoglobin 13.2 12.0 - 16.0 g/dL   HCT 63.837.8 75.635.0 - 43.347.0 %   MCV 89.1 80.0 - 100.0 fL   MCH 31.2 26.0 - 34.0 pg   MCHC 35.0 32.0 - 36.0 g/dL   RDW 29.514.0 18.811.5 - 41.614.5 %   Platelets 281 150 - 440 K/uL  Comprehensive  metabolic panel     Status: Abnormal   Collection Time: 06/18/17  7:02 AM  Result Value Ref Range   Sodium 133 (L) 135 - 145 mmol/L   Potassium 3.5 3.5 - 5.1 mmol/L   Chloride 105 101 - 111 mmol/L   CO2 19 (L) 22 - 32 mmol/L   Glucose, Bld 94 65 - 99 mg/dL   BUN 8 6 - 20 mg/dL   Creatinine, Ser 6.060.59 0.44 - 1.00 mg/dL   Calcium 8.7 (L) 8.9 - 10.3 mg/dL   Total Protein 6.4 (L) 6.5 - 8.1 g/dL   Albumin 3.2 (L) 3.5 - 5.0 g/dL   AST 22 15 - 41 U/L   ALT 12 (L) 14 - 54 U/L   Alkaline Phosphatase 121 38 - 126 U/L   Total Bilirubin 1.0 0.3 - 1.2 mg/dL   GFR calc non Af Amer >60 >60 mL/min   GFR calc Af Amer >60 >60 mL/min   Anion gap 9 5 - 15  Protein / creatinine ratio, urine     Status: None   Collection Time: 06/18/17  7:02 AM  Result Value Ref Range   Creatinine, Urine 44 mg/dL   Total Protein, Urine <6 mg/dL   Protein Creatinine Ratio        0.00 - 0.15 mg/mg[Cre]  Type and screen     Status: None   Collection Time: 06/18/17  7:02 AM  Result Value Ref Range   ABO/RH(D) B POS    Antibody Screen NEG    Sample Expiration      06/21/2017 Performed at [facility], [address] [place], [place] [address]     Pertinent Results:  Prenatal Labs: Blood type/Rh B Pos  Antibody screen neg  Rubella Immune  Varicella Immune  RPR NR  HBsAg Neg  HIV NR  GC neg  Chlamydia neg  Genetic screening negative  1 hour GTT 154  3 hour GTT WNL  GBS neg   FHT: 125bpm, mod variability, + accels, no decels TOCO: uterine irritability SVE:  Dilation: 1 / Effacement (%): 30 / Station: Ballotable    Cephalic by leopolds and US  No results found.  Assessment:  Angela Clark is a 32 y.o. G2P1001 female at 7628w0d with IOL for GHTN   Plan:  1. Admit to Labor & Delivery; consents reviewed and obtained  2. Fetal Well being  - Fetal Tracing: Cat I tracing - Group B Streptococcus ppx indicated: neg - Presentation: cephalic confirmed by US   3. Routine OB: - Prenatal labs  reviewed, as above - Rh B Pos - CBC, T&S, RPR on admit - Clear fluids, IVF  4. Induction of Labor -  Contractions: external toco in place -  Pelvis proven to 9lbs -  Plan for induction with Cytotec for ripening -  Plan for continuous fetal monitoring  -  Maternal pain control as desired; requesting regional anesthesia - Anticipate vaginal delivery  5. Post Partum Planning: - Infant feeding: breast - Contraception: undecided  Robie Oats A, CNM 06/18/17 9:16 AM

## 2017-06-18 NOTE — Progress Notes (Signed)
Labor Progress Note  Angela BeetsLaura K Brant is a 32 y.o. G2P1001 at 1828w0d by Patient's last menstrual period was 07/27/2016 (exact date).Confirmed with US at 1882w3d.    [facility]   Subjective: breathing well with UCs, desires epidural   Objective: BP 128/74   Pulse 89   Temp 98.2 F (36.8 C) (Oral)   Resp 18   Ht 5\' 5"  (1.651 m)   Wt 199 lb (90.3 kg)   LMP 07/27/2016 (Exact Date)   BMI 33.12 kg/m  Notable VS details: reviewed  Fetal Assessment: FHT:  FHR: 135 bpm, variability: moderate,  accelerations:  Present,  decelerations:  Absent Category/reactivity:  Category I UC:   regular, every 2-3 minutes; Pitocin at 4014mu/min SVE:   Dilation: 6 Effacement (%): 70, 80 Station: -2 [facility] noted to be in vagina, mod amt bloody show. Cook cath removed.  Membrane status:intact with BBOW; 6/75/-1   Labs: Lab Results  Component Value Date   WBC 17.1 (H) 06/18/2017   HGB 13.2 06/18/2017   HCT 37.8 06/18/2017   MCV 89.1 06/18/2017   PLT 281 06/18/2017    Assessment / Plan: G2P1 at 37+0wks, IOL for GHTN.  Labor: s/p 1 dose cytotec, [facility]  Preeclampsia:  no signs or symptoms of toxicity and labs stable Fetal Wellbeing:  Category I Pain Control:  Epidural requested now, s/p 2 doses fentanyl.  I/D:  n/a Anticipated MOD:  NSVD    McVey, REBECCA A, CNM 06/18/2017, 6:14 PM

## 2017-06-19 LAB — CBC
HEMATOCRIT: 33.1 % — AB (ref 35.0–47.0)
Hemoglobin: 11.3 g/dL — ABNORMAL LOW (ref 12.0–16.0)
MCH: 30.4 pg (ref 26.0–34.0)
MCHC: 34.2 g/dL (ref 32.0–36.0)
MCV: 88.9 fL (ref 80.0–100.0)
Platelets: 233 10*3/uL (ref 150–440)
RBC: 3.73 MIL/uL — AB (ref 3.80–5.20)
RDW: 13.7 % (ref 11.5–14.5)
WBC: 25.4 10*3/uL — ABNORMAL HIGH (ref 3.6–11.0)

## 2017-06-19 MED ORDER — ONDANSETRON HCL 4 MG PO TABS: 4.0000 mg | ORAL_TABLET | ORAL | Status: DC | PRN

## 2017-06-19 MED ORDER — IBUPROFEN 600 MG PO TABS
600.0000 mg | ORAL_TABLET | Freq: Four times a day (QID) | ORAL | Status: DC
  Administered 2017-06-19 – 2017-06-20 (×2): 600 mg via ORAL
  Filled 2017-06-19 (×2): qty 1

## 2017-06-19 MED ORDER — WITCH HAZEL-GLYCERIN EX PADS
1.0000 | MEDICATED_PAD | CUTANEOUS | Status: DC | PRN
Start: 2017-06-19 — End: 2017-06-20

## 2017-06-19 MED ORDER — PRENATAL MULTIVITAMIN CH
1.0000 | ORAL_TABLET | Freq: Every day | ORAL | Status: DC
  Administered 2017-06-19 – 2017-06-20 (×2): 1 via ORAL
  Filled 2017-06-19 (×2): qty 1

## 2017-06-19 MED ORDER — ZOLPIDEM TARTRATE 5 MG PO TABS: 5.0000 mg | ORAL_TABLET | Freq: Every evening | ORAL | Status: DC | PRN

## 2017-06-19 MED ORDER — SENNOSIDES-DOCUSATE SODIUM 8.6-50 MG PO TABS
2.0000 | ORAL_TABLET | ORAL | Status: DC
  Administered 2017-06-20: 2 via ORAL
  Filled 2017-06-19: qty 2

## 2017-06-19 MED ORDER — COCONUT OIL OIL: 1.0000 "application " | TOPICAL_OIL | Status: DC | PRN

## 2017-06-19 MED ORDER — SIMETHICONE 80 MG PO CHEW: 80.0000 mg | CHEWABLE_TABLET | ORAL | Status: DC | PRN

## 2017-06-19 MED ORDER — DIBUCAINE 1 % RE OINT
1.0000 | TOPICAL_OINTMENT | RECTAL | Status: DC | PRN
Start: 2017-06-19 — End: 2017-06-20

## 2017-06-19 MED ORDER — ONDANSETRON HCL 4 MG/2ML IJ SOLN: 4.0000 mg | INTRAMUSCULAR | Status: DC | PRN

## 2017-06-19 MED ORDER — BENZOCAINE-MENTHOL 20-0.5 % EX AERO: 1.0000 "application " | INHALATION_SPRAY | CUTANEOUS | Status: DC | PRN

## 2017-06-19 MED ORDER — ACETAMINOPHEN 325 MG PO TABS: 650.0000 mg | ORAL_TABLET | ORAL | Status: DC | PRN

## 2017-06-19 MED ORDER — DIPHENHYDRAMINE HCL 25 MG PO CAPS: 25.0000 mg | ORAL_CAPSULE | Freq: Four times a day (QID) | ORAL | Status: DC | PRN

## 2017-06-19 NOTE — Anesthesia Postprocedure Evaluation (Signed)
Anesthesia Post Note  Patient: Angela Clark  Procedure(s) Performed: AN AD HOC LABOR EPIDURAL  Patient location during evaluation: Mother Baby Anesthesia Type: Epidural Level of consciousness: awake, awake and alert, oriented and patient cooperative Pain management: pain level controlled Respiratory status: spontaneous breathing, nonlabored ventilation and respiratory function stable Cardiovascular status: stable Postop Assessment: no headache, no backache and adequate PO intake Anesthetic complications: no     Last Vitals:  Vitals:   06/19/17 0550 06/19/17 0605  BP: 115/69 122/63  Pulse: 82 82  Resp:    Temp:    SpO2:      Last Pain:  Vitals:   06/19/17 0530  TempSrc: Oral  PainSc: 0-No pain                 Giordana Weinheimer,  Young Mulvey R

## 2017-06-19 NOTE — Discharge Summary (Addendum)
Obstetrical Discharge Summary  Patient Name: Angela BeetsLaura K Clark DOB: 02/10/1986 MRN: [id number]  Date of Admission: 06/18/2017 Date of Delivery: 06/19/17 Delivered by: Bonnell Public McVey CNM Date of Discharge: 06/19/2017  [facility]  WUJ:[id number]:Patient's last menstrual period was 07/27/2016 (exact date). EDC Estimated Date of Delivery: 07/09/17 Gestational Age at Delivery: 13103w1d   Antepartum complications:  1. GHTN 2. Anxiety; restarted Lexapro 10mg /day 03/15/17 3. Thyroid disease:Hashimoto's and takes Synthroid 50 mcg   Admitting Diagnosis: IOL d/t GHTN, 37wks Secondary Diagnosis: SVD  Patient Active Problem List   Diagnosis Date Noted  . Gestational hypertension 06/18/2017  . Gestational hypertension w/o significant proteinuria in 3rd trimester 06/13/2017  . Sepsis (HCC) 08/18/2016  . Pelvic pain 08/07/2016    Augmentation: AROM, Pitocin, Cytotec and Foley Balloon Complications: None Intrapartum complications/course: none, see complete delivery note Date of Delivery: 06/19/17 Delivered By: Bonnell Public McVey CNM Delivery Type: spontaneous vaginal delivery Anesthesia: epidural Placenta: spontaneous Laceration: none Episiotomy: none  Newborn Data: Live born female  Birth Weight: 6 lb 9.5 oz (2990 g) APGAR: 8, 9  Newborn Delivery   Birth date/time:  06/19/2017 00:28:00 Delivery type:  Vaginal, Spontaneous       Postpartum Procedures: none   Post partum course: Stable  Patient had an uncomplicated postpartum course.  By time of discharge on PPD#1, her pain was controlled on oral pain medications; she had appropriate lochia and was ambulating, voiding without difficulty and tolerating regular diet.  She was deemed stable for discharge to home.      Discharge Physical Exam:  BP 118/60   Pulse 83   Temp 98.3 F (36.8 C) (Oral)   Resp 14   Ht 5\' 5"  (1.651 m)   Wt 199 lb (90.3 kg)   LMP 07/27/2016 (Exact Date)   SpO2 100%   BMI 33.12 kg/m   General: NAD HEART:S1S2,  RRR, NO M/R/G Pulm: CTABL, nl effort ABD: s/nd/nt, fundus firm and below the umbilicus Lochia: moderate Perineum: well approximated/intact DVT Evaluation: LE non-ttp, no evidence of DVT on exam.  Hemoglobin  Date Value Ref Range Status  06/18/2017 13.2 12.0 - 16.0 g/dL Final   HGB  Date Value Ref Range Status  01/04/2013 14.5 12.0 - 16.0 g/dL Final   HCT  Date Value Ref Range Status  06/18/2017 37.8 35.0 - 47.0 % Final  01/05/2013 33.1 (L) 35.0 - 47.0 % Final     Disposition: stable, discharge to home. Baby Feeding: breastmilk Baby Disposition: home with mom  Rh Immune globulin given: n/a Rubella vaccine given: n/a Varicella vaccine given: n/a Tdap vaccine given in AP or PP setting: AP Flu vaccine given in AP or PP setting: AP  Contraception: lILETTA IUD  Prenatal Labs:  Blood type/Rh B Pos  Antibody screen neg  Rubella Immune  Varicella Immune  RPR NR  HBsAg Neg  HIV NR  GC neg  Chlamydia neg  Genetic screening negative  1 hour GTT 154  3 hour GTT WNL  GBS neg       Plan:  Angela Clark was discharged to home in good condition. Follow-up appointment with delivering provider in 6 weeks. Take an over the counter Fe replacement  Discharge Medications: IBUPROFEN, FE  Signed:  Myrtie Cruisearon W. Jones,RN, MSN, CNM, FNP Certified Nurse Midwife [facility]

## 2017-06-19 NOTE — Progress Notes (Signed)
Post Partum Day 0 Subjective: no complaints, up ad lib and voiding  Objective: Blood pressure 104/60, pulse 92, temperature 97.8 F (36.6 C), temperature source Oral, resp. rate 18, height 5\' 5"  (1.651 m), weight 90.3 kg (199 lb), last menstrual period 07/27/2016, SpO2 97 %, unknown if currently breastfeeding.  Physical Exam:  General: alert, cooperative and appears stated age  Recent Labs    06/18/17 0702 06/19/17 0753  HGB 13.2 11.3*  HCT 37.8 33.1*    Assessment/Plan: Breastfeeding  Doing well   LOS: 1 day   Angela Clark 06/19/2017, 10:29 PM

## 2017-06-20 LAB — RPR: RPR: NONREACTIVE

## 2017-06-20 NOTE — Progress Notes (Signed)
Patient discharged home with infant and spouse. Discharge instructions, prescriptions and follow up appointment given to and reviewed with patient and spouse. Patient verbalized understanding. Escorted out via wheelchair by auxilary.

## 2017-06-20 NOTE — Discharge Instructions (Signed)
Anemia Anemia is a condition in which you do not have enough red blood cells or hemoglobin. Hemoglobin is a substance in red blood cells that carries oxygen. When you do not have enough red blood cells or hemoglobin (are anemic), your body cannot get enough oxygen and your organs may not work properly. As a result, you may feel very tired or have other problems. What are the causes? Common causes of anemia include:  Excessive bleeding. Anemia can be caused by excessive bleeding inside or outside the body, including bleeding from the intestine or from periods in women.  Poor nutrition.  Long-lasting (chronic) kidney, thyroid, and liver disease.  Bone marrow disorders.  Cancer and treatments for cancer.  HIV (human immunodeficiency virus) and AIDS (acquired immunodeficiency syndrome).  Treatments for HIV and AIDS.  Spleen problems.  Blood disorders.  Infections, medicines, and autoimmune disorders that destroy red blood cells.  What are the signs or symptoms? Symptoms of this condition include:  Minor weakness.  Dizziness.  Headache.  Feeling heartbeats that are irregular or faster than normal (palpitations).  Shortness of breath, especially with exercise.  Paleness.  Cold sensitivity.  Indigestion.  Nausea.  Difficulty sleeping.  Difficulty concentrating.  Symptoms may occur suddenly or develop slowly. If your anemia is mild, you may not have symptoms. How is this diagnosed? This condition is diagnosed based on:  Blood tests.  Your medical history.  A physical exam.  Bone marrow biopsy.  Your health care provider may also check your stool (feces) for blood and may do additional testing to look for the cause of your bleeding. You may also have other tests, including:  Imaging tests, such as a CT scan or MRI.  Endoscopy.  Colonoscopy.  How is this treated? Treatment for this condition depends on the cause. If you continue to lose a lot of blood,  you may need to be treated at a hospital. Treatment may include:  Taking supplements of iron, vitamin T02, or folic acid.  Taking a hormone medicine (erythropoietin) that can help to stimulate red blood cell growth.  Having a blood transfusion. This may be needed if you lose a lot of blood.  Making changes to your diet.  Having surgery to remove your spleen.  Follow these instructions at home:  Take over-the-counter and prescription medicines only as told by your health care provider.  Take supplements only as told by your health care provider.  Follow any diet instructions that you were given.  Keep all follow-up visits as told by your health care provider. This is important. Contact a health care provider if:  You develop new bleeding anywhere in the body. Get help right away if:  You are very weak.  You are short of breath.  You have pain in your abdomen or chest.  You are dizzy or feel faint.  You have trouble concentrating.  You have bloody or black, tarry stools.  You vomit repeatedly or you vomit up blood. Summary  Anemia is a condition in which you do not have enough red blood cells or enough of a substance in your red blood cells that carries oxygen (hemoglobin).  Symptoms may occur suddenly or develop slowly.  If your anemia is mild, you may not have symptoms.  This condition is diagnosed with blood tests as well as a medical history and physical exam. Other tests may be needed.  Treatment for this condition depends on the cause of the anemia. This information is not intended to replace advice  given to you by your health care provider. Make sure you discuss any questions you have with your health care provider. Document Released: 04/21/2004 Document Revised: 04/15/2016 Document Reviewed: 04/15/2016 Elsevier Interactive Patient Education  [employer]. Bleeding: Your bleeding could continue up to 6 weeks, the flow should gradually decrease and the  color should become dark then lightened over the next couple of weeks. If you notice you are bleeding heavily or passing clots larger than the size of your fist, PLEASE call your physician. No TAMPONS, DOUCHING, ENEMAS OR SEXUAL INTERCOURSE for 6 weeks.  Stitches: Shower daily with mild soap and water. Stitches will dissolve over the next couple of weeks, if you experience any discomfort in the vaginal area you may sit in warm water 15-20 minutes, 3-4 times per day. Just enough water to cover vaginal area.  AfterPains: This is the uterus contracting back to its normal position and size. Use medications prescribed or recommended by your physician to help relieve this discomfort.  Bowels/Hemorrhoids: Drink plenty of water and stay active. Increase fiber, fresh fruits and vegetables in your diet.  Rest/Activity: Rest when the baby is resting;  Do not lift > 10 lbs for 6 weeks. No driving for 1-2 weeks.  Bathing: Shower daily!  Diet: Continue daily prenatal vitamin and iron until your follow up visit to help replenish nutrients and vitamins. If breastfeeding eat extra calories and increase your fluid intake to 12 glasses a day.  Contraception: Consult with your provider on what method of birth control you would like to use.  Breastfeeding: You may have a slight fever when your milk comes in, but it should go away on its own. If it does not, and rises above 101.0 please call the doctor. Bottlefeeding: wear a snug fitting bra without underwires continuously for 3-5days, avoid any nipple/breast stimulation. If engorgement occurs, take ibuprofen as prescribed and apply fresh green cabbage leaves directly to your breasts inside the bra cups.  Postpartum "BLUES": It is common to emotional days after delivery, however if it persist for greater than 2 weeks or if you feel concerned please let your physician know immediately. This is hormone driven and nothing you can control so please let someone know how you feel.    Follow Up Visit: Please schedule a follow up visit with your delivering provider   Call office if you have any of the following: headache, visual changes, fever >101.0 F, chills, breast concerns, excessive vaginal bleeding, incision drainage or problems, leg pain or redness, depression or any other concerns.   For concerns about your baby, please call your pediatrician For breastfeeding concerns, the lactation consultant can be reached at [phone]      Vaginal Delivery, Care After Refer to this sheet in the next few weeks. These instructions provide you with information about caring for yourself after vaginal delivery. Your health care provider may also give you more specific instructions. Your treatment has been planned according to current medical practices, but problems sometimes occur. Call your health care provider if you have any problems or questions. What can I expect after the procedure? After vaginal delivery, it is common to have:  Some bleeding from your vagina.  Soreness in your abdomen, your vagina, and the area of skin between your vaginal opening and your anus (perineum).  Pelvic cramps.  Fatigue.  Follow these instructions at home: Medicines  Take over-the-counter and prescription medicines only as told by your health care provider.  If you were prescribed an antibiotic  medicine, take it as told by your health care provider. Do not stop taking the antibiotic until it is finished. Driving   Do not drive or operate heavy machinery while taking prescription pain medicine.  Do not drive for 24 hours if you received a sedative. Lifestyle  Do not drink alcohol. This is especially important if you are breastfeeding or taking medicine to relieve pain.  Do not use tobacco products, including cigarettes, chewing tobacco, or e-cigarettes. If you need help quitting, ask your health care provider. Eating and drinking  Drink at least 8 eight-ounce glasses of water  every day unless you are told not to by your health care provider. If you choose to breastfeed your baby, you may need to drink more water than this.  Eat high-fiber foods every day. These foods may help prevent or relieve constipation. High-fiber foods include: ? Whole grain cereals and breads. ? Brown rice. ? Beans. ? Fresh fruits and vegetables. Activity  Return to your normal activities as told by your health care provider. Ask your health care provider what activities are safe for you.  Rest as much as possible. Try to rest or take a nap when your baby is sleeping.  Do not lift anything that is heavier than your baby or 10 lb (4.5 kg) until your health care provider says that it is safe.  Talk with your health care provider about when you can engage in sexual activity. This may depend on your: ? Risk of infection. ? Rate of healing. ? Comfort and desire to engage in sexual activity. Vaginal Care  If you have an episiotomy or a vaginal tear, check the area every day for signs of infection. Check for: ? More redness, swelling, or pain. ? More fluid or blood. ? Warmth. ? Pus or a bad smell.  Do not use tampons or douches until your health care provider says this is safe.  Watch for any blood clots that may pass from your vagina. These may look like clumps of dark red, brown, or black discharge. General instructions  Keep your perineum clean and dry as told by your health care provider.  Wear loose, comfortable clothing.  Wipe from front to back when you use the toilet.  Ask your health care provider if you can shower or take a bath. If you had an episiotomy or a perineal tear during labor and delivery, your health care provider may tell you not to take baths for a certain length of time.  Wear a bra that supports your breasts and fits you well.  If possible, have someone help you with household activities and help care for your baby for at least a few days after you leave  the hospital.  Keep all follow-up visits for you and your baby as told by your health care provider. This is important. Contact a health care provider if:  You have: ? Vaginal discharge that has a bad smell. ? Difficulty urinating. ? Pain when urinating. ? A sudden increase or decrease in the frequency of your bowel movements. ? More redness, swelling, or pain around your episiotomy or vaginal tear. ? More fluid or blood coming from your episiotomy or vaginal tear. ? Pus or a bad smell coming from your episiotomy or vaginal tear. ? A fever. ? A rash. ? Little or no interest in activities you used to enjoy. ? Questions about caring for yourself or your baby.  Your episiotomy or vaginal tear feels warm to the touch.  Your episiotomy or vaginal tear is separating or does not appear to be healing.  Your breasts are painful, hard, or turn red.  You feel unusually sad or worried.  You feel nauseous or you vomit.  You pass large blood clots from your vagina. If you pass a blood clot from your vagina, save it to show to your health care provider. Do not flush blood clots down the toilet without having your health care provider look at them.  You urinate more than usual.  You are dizzy or light-headed.  You have not breastfed at all and you have not had a menstrual period for 12 weeks after delivery.  You have stopped breastfeeding and you have not had a menstrual period for 12 weeks after you stopped breastfeeding. Get help right away if:  You have: ? Pain that does not go away or does not get better with medicine. ? Chest pain. ? Difficulty breathing. ? Blurred vision or spots in your vision. ? Thoughts about hurting yourself or your baby.  You develop pain in your abdomen or in one of your legs.  You develop a severe headache.  You faint.  You bleed from your vagina so much that you fill two sanitary pads in one hour. This information is not intended to replace advice  given to you by your health care provider. Make sure you discuss any questions you have with your health care provider. Document Released: 03/11/2000 Document Revised: 08/26/2015 Document Reviewed: 03/29/2015 Elsevier Interactive Patient Education  2018 [employer].   Breastfeeding Choosing to breastfeed is one of the best decisions you can make for yourself and your baby. A change in hormones during pregnancy causes your breasts to make breast milk in your milk-producing glands. Hormones prevent breast milk from being released before your baby is born. They also prompt milk flow after birth. Once breastfeeding has begun, thoughts of your baby, as well as his or her sucking or crying, can stimulate the release of milk from your milk-producing glands. Benefits of breastfeeding Research shows that breastfeeding offers many health benefits for infants and mothers. It also offers a cost-free and convenient way to feed your baby. For your baby  Your first milk (colostrum) helps your baby's digestive system to function better.  Special cells in your milk (antibodies) help your baby to fight off infections.  Breastfed babies are less likely to develop asthma, allergies, obesity, or type 2 diabetes. They are also at lower risk for sudden infant death syndrome (SIDS).  Nutrients in breast milk are better able to meet your babys needs compared to infant formula.  Breast milk improves your baby's brain development. For you  Breastfeeding helps to create a very special bond between you and your baby.  Breastfeeding is convenient. Breast milk costs nothing and is always available at the correct temperature.  Breastfeeding helps to burn calories. It helps you to lose the weight that you gained during pregnancy.  Breastfeeding makes your uterus return faster to its size before pregnancy. It also slows bleeding (lochia) after you give birth.  Breastfeeding helps to lower your risk of developing type  2 diabetes, osteoporosis, rheumatoid arthritis, cardiovascular disease, and breast, ovarian, uterine, and endometrial cancer later in life. Breastfeeding basics Starting breastfeeding  Find a comfortable place to sit or lie down, with your neck and back well-supported.  Place a pillow or a rolled-up blanket under your baby to bring him or her to the level of your breast (if you are seated).  Nursing pillows are specially designed to help support your arms and your baby while you breastfeed.  Make sure that your baby's tummy (abdomen) is facing your abdomen.  Gently massage your breast. With your fingertips, massage from the outer edges of your breast inward toward the nipple. This encourages milk flow. If your milk flows slowly, you may need to continue this action during the feeding.  Support your breast with 4 fingers underneath and your thumb above your nipple (make the letter "C" with your hand). Make sure your fingers are well away from your nipple and your babys mouth.  Stroke your baby's lips gently with your finger or nipple.  When your baby's mouth is open wide enough, quickly bring your baby to your breast, placing your entire nipple and as much of the areola as possible into your baby's mouth. The areola is the colored area around your nipple. ? More areola should be visible above your baby's upper lip than below the lower lip. ? Your baby's lips should be opened and extended outward (flanged) to ensure an adequate, comfortable latch. ? Your baby's tongue should be between his or her lower gum and your breast.  Make sure that your baby's mouth is correctly positioned around your nipple (latched). Your baby's lips should create a seal on your breast and be turned out (everted).  It is common for your baby to suck about 2-3 minutes in order to start the flow of breast milk. Latching Teaching your baby how to latch onto your breast properly is very important. An improper latch can  cause nipple pain, decreased milk supply, and poor weight gain in your baby. Also, if your baby is not latched onto your nipple properly, he or she may swallow some air during feeding. This can make your baby fussy. Burping your baby when you switch breasts during the feeding can help to get rid of the air. However, teaching your baby to latch on properly is still the best way to prevent fussiness from swallowing air while breastfeeding. Signs that your baby has successfully latched onto your nipple  Silent tugging or silent sucking, without causing you pain. Infant's lips should be extended outward (flanged).  Swallowing heard between every 3-4 sucks once your milk has started to flow (after your let-down milk reflex occurs).  Muscle movement above and in front of his or her ears while sucking.  Signs that your baby has not successfully latched onto your nipple  Sucking sounds or smacking sounds from your baby while breastfeeding.  Nipple pain.  If you think your baby has not latched on correctly, slip your finger into the corner of your babys mouth to break the suction and place it between your baby's gums. Attempt to start breastfeeding again. Signs of successful breastfeeding Signs from your baby  Your baby will gradually decrease the number of sucks or will completely stop sucking.  Your baby will fall asleep.  Your baby's body will relax.  Your baby will retain a small amount of milk in his or her mouth.  Your baby will let go of your breast by himself or herself.  Signs from you  Breasts that have increased in firmness, weight, and size 1-3 hours after feeding.  Breasts that are softer immediately after breastfeeding.  Increased milk volume, as well as a change in milk consistency and color by the fifth day of breastfeeding.  Nipples that are not sore, cracked, or bleeding.  Signs that your baby is getting enough milk  Wetting at least 1-2 diapers during the first 24  hours after birth.  Wetting at least 5-6 diapers every 24 hours for the first week after birth. The urine should be clear or pale yellow by the age of 5 days.  Wetting 6-8 diapers every 24 hours as your baby continues to grow and develop.  At least 3 stools in a 24-hour period by the age of 5 days. The stool should be soft and yellow.  At least 3 stools in a 24-hour period by the age of 7 days. The stool should be seedy and yellow.  No loss of weight greater than 10% of birth weight during the first 3 days of life.  Average weight gain of 4-7 oz (113-198 g) per week after the age of 4 days.  Consistent daily weight gain by the age of 5 days, without weight loss after the age of 2 weeks. After a feeding, your baby may spit up a small amount of milk. This is normal. Breastfeeding frequency and duration Frequent feeding will help you make more milk and can prevent sore nipples and extremely full breasts (breast engorgement). Breastfeed when you feel the need to reduce the fullness of your breasts or when your baby shows signs of hunger. This is called "breastfeeding on demand." Signs that your baby is hungry include:  Increased alertness, activity, or restlessness.  Movement of the head from side to side.  Opening of the mouth when the corner of the mouth or cheek is stroked (rooting).  Increased sucking sounds, smacking lips, cooing, sighing, or squeaking.  Hand-to-mouth movements and sucking on fingers or hands.  Fussing or crying.  Avoid introducing a pacifier to your baby in the first 4-6 weeks after your baby is born. After this time, you may choose to use a pacifier. Research has shown that pacifier use during the first year of a baby's life decreases the risk of sudden infant death syndrome (SIDS). Allow your baby to feed on each breast as long as he or she wants. When your baby unlatches or falls asleep while feeding from the first breast, offer the second breast. Because  newborns are often sleepy in the first few weeks of life, you may need to awaken your baby to get him or her to feed. Breastfeeding times will vary from baby to baby. However, the following rules can serve as a guide to help you make sure that your baby is properly fed:  Newborns (babies 41 weeks of age or younger) may breastfeed every 1-3 hours.  Newborns should not go without breastfeeding for longer than 3 hours during the day or 5 hours during the night.  You should breastfeed your baby a minimum of 8 times in a 24-hour period.  Breast milk pumping Pumping and storing breast milk allows you to make sure that your baby is exclusively fed your breast milk, even at times when you are unable to breastfeed. This is especially important if you go back to work while you are still breastfeeding, or if you are not able to be present during feedings. Your lactation consultant can help you find a method of pumping that works best for you and give you guidelines about how long it is safe to store breast milk. Caring for your breasts while you breastfeed Nipples can become dry, cracked, and sore while breastfeeding. The following recommendations can help keep your breasts moisturized and healthy:  Avoid using soap on your nipples.  Wear a supportive bra designed especially  for nursing. Avoid wearing underwire-style bras or extremely tight bras (sports bras).  Air-dry your nipples for 3-4 minutes after each feeding.  Use only cotton bra pads to absorb leaked breast milk. Leaking of breast milk between feedings is normal.  Use lanolin on your nipples after breastfeeding. Lanolin helps to maintain your skin's normal moisture barrier. Pure lanolin is not harmful (not toxic) to your baby. You may also hand express a few drops of breast milk and gently massage that milk into your nipples and allow the milk to air-dry.  In the first few weeks after giving birth, some women experience breast engorgement.  Engorgement can make your breasts feel heavy, warm, and tender to the touch. Engorgement peaks within 3-5 days after you give birth. The following recommendations can help to ease engorgement:  Completely empty your breasts while breastfeeding or pumping. You may want to start by applying warm, moist heat (in the shower or with warm, water-soaked hand towels) just before feeding or pumping. This increases circulation and helps the milk flow. If your baby does not completely empty your breasts while breastfeeding, pump any extra milk after he or she is finished.  Apply ice packs to your breasts immediately after breastfeeding or pumping, unless this is too uncomfortable for you. To do this: ? Put ice in a plastic bag. ? Place a towel between your skin and the bag. ? Leave the ice on for 20 minutes, 2-3 times a day.  Make sure that your baby is latched on and positioned properly while breastfeeding.  If engorgement persists after 48 hours of following these recommendations, contact your health care provider or a Science writer. Overall health care recommendations while breastfeeding  Eat 3 healthy meals and 3 snacks every day. Well-nourished mothers who are breastfeeding need an additional 450-500 calories a day. You can meet this requirement by increasing the amount of a balanced diet that you eat.  Drink enough water to keep your urine pale yellow or clear.  Rest often, relax, and continue to take your prenatal vitamins to prevent fatigue, stress, and low vitamin and mineral levels in your body (nutrient deficiencies).  Do not use any products that contain nicotine or tobacco, such as cigarettes and e-cigarettes. Your baby may be harmed by chemicals from cigarettes that pass into breast milk and exposure to secondhand smoke. If you need help quitting, ask your health care provider.  Avoid alcohol.  Do not use illegal drugs or marijuana.  Talk with your health care provider before  taking any medicines. These include over-the-counter and prescription medicines as well as vitamins and herbal supplements. Some medicines that may be harmful to your baby can pass through breast milk.  It is possible to become pregnant while breastfeeding. If birth control is desired, ask your health care provider about options that will be safe while breastfeeding your baby. Where to find more information: [employer]: www.llli.org Contact a health care provider if:  You feel like you want to stop breastfeeding or have become frustrated with breastfeeding.  Your nipples are cracked or bleeding.  Your breasts are red, tender, or warm.  You have: ? Painful breasts or nipples. ? A swollen area on either breast. ? A fever or chills. ? Nausea or vomiting. ? Drainage other than breast milk from your nipples.  Your breasts do not become full before feedings by the fifth day after you give birth.  You feel sad and depressed.  Your baby is: ? Too sleepy  to eat well. ? Having trouble sleeping. ? More than 55 week old and wetting fewer than 6 diapers in a 24-hour period. ? Not gaining weight by 29 days of age.  Your baby has fewer than 3 stools in a 24-hour period.  Your baby's skin or the white parts of his or her eyes become yellow. Get help right away if:  Your baby is overly tired (lethargic) and does not want to wake up and feed.  Your baby develops an unexplained fever. Summary  Breastfeeding offers many health benefits for infant and mothers.  Try to breastfeed your infant when he or she shows early signs of hunger.  Gently tickle or stroke your baby's lips with your finger or nipple to allow the baby to open his or her mouth. Bring the baby to your breast. Make sure that much of the areola is in your baby's mouth. Offer one side and burp the baby before you offer the other side.  Talk with your health care provider or lactation consultant if you have  questions or you face problems as you breastfeed. This information is not intended to replace advice given to you by your health care provider. Make sure you discuss any questions you have with your health care provider. Document Released: 03/14/2005 Document Revised: 04/15/2016 Document Reviewed: 04/15/2016 Elsevier Interactive Patient Education  [employer].

## 2017-06-20 NOTE — Plan of Care (Signed)
Vs stable; up ad lib; regular diet; has declined motrin this shift; has asked about being discharged today

## 2017-08-11 IMAGING — CT CT RENAL STONE PROTOCOL
3 of 4 series · 10 of 46 positions shown, 15 images · non-contrast
Comparison: CT of the abdomen and pelvis, and pelvic ultrasound,
performed 01/28/2011

CLINICAL DATA: Acute onset of left upper quadrant abdominal pain
and vomiting. Initial encounter.

EXAM:
CT ABDOMEN AND PELVIS WITHOUT CONTRAST
TECHNIQUE: Multidetector CT imaging of the abdomen and pelvis was performed
following the standard protocol without IV contrast.

[Series 4: lung bases · axial · 0.70mm/px · z∈[-662,-552]mm · 6 of 32 slices shown, 11 images]
[im 5/32  soft-tissue]
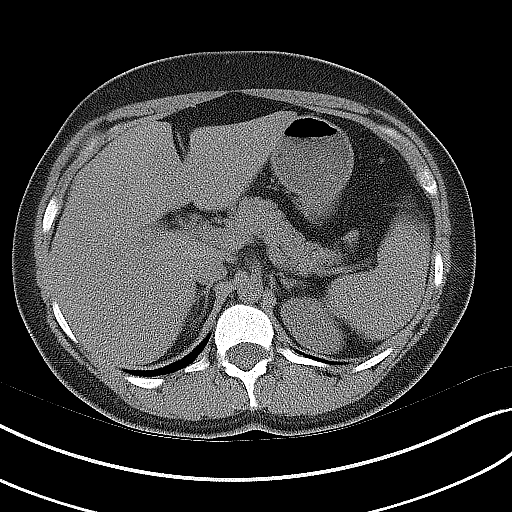
[im 5/32  bone]
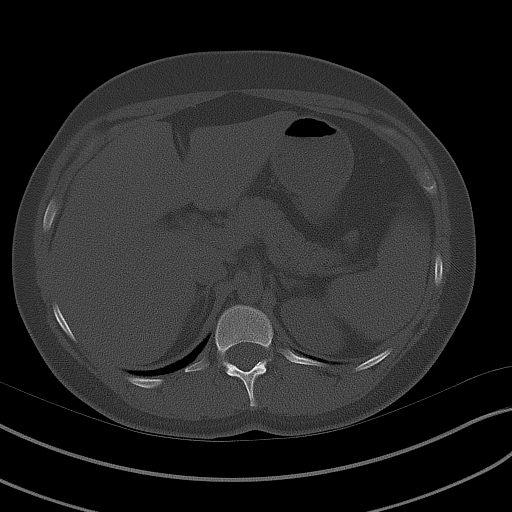
[im 9/32  soft-tissue]
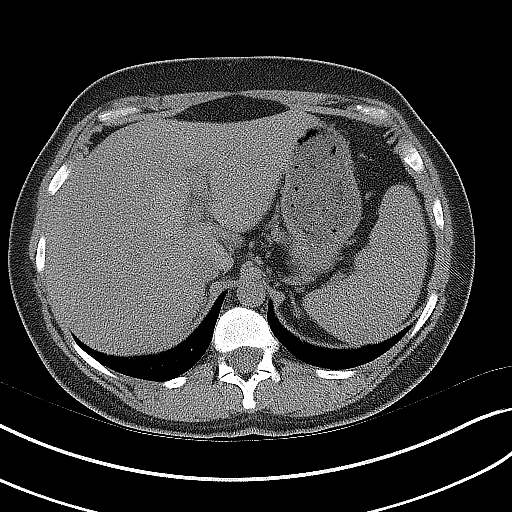
[im 14/32  soft-tissue]
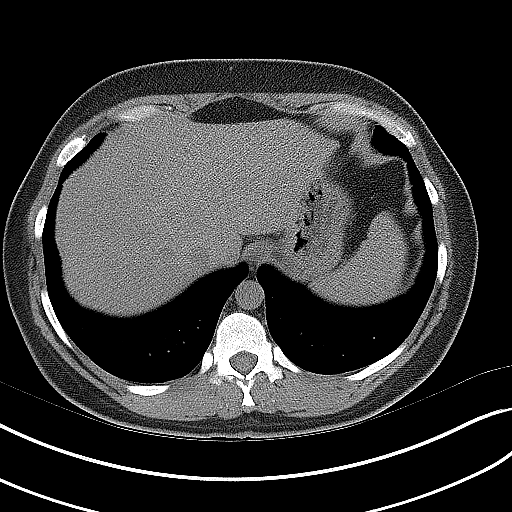
[im 14/32  lung]
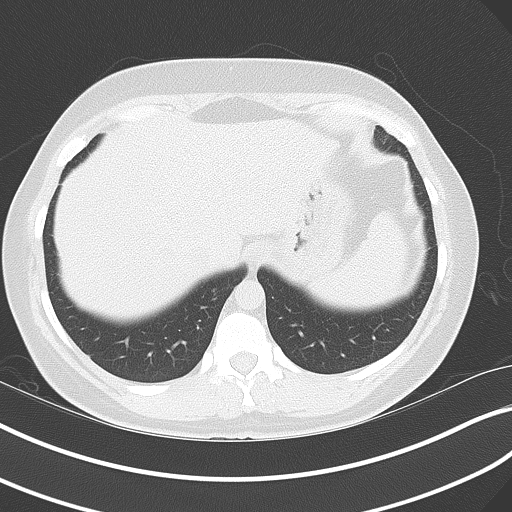
[im 18/32  soft-tissue]
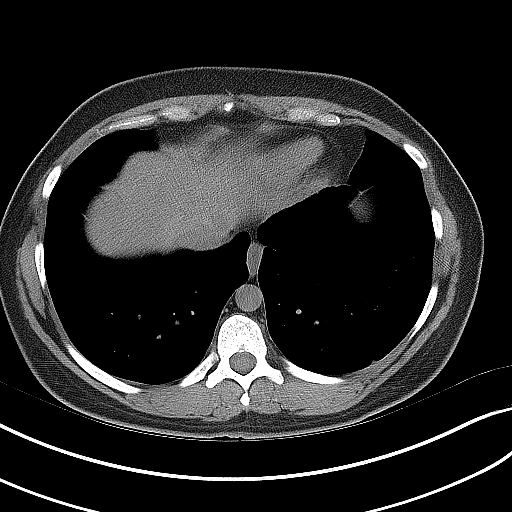
[im 18/32  lung]
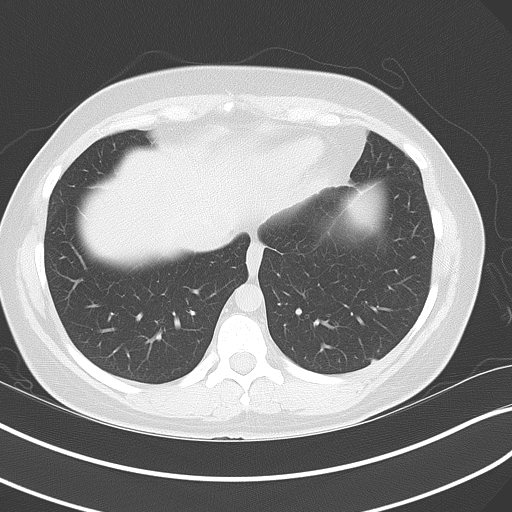
[im 23/32  soft-tissue]
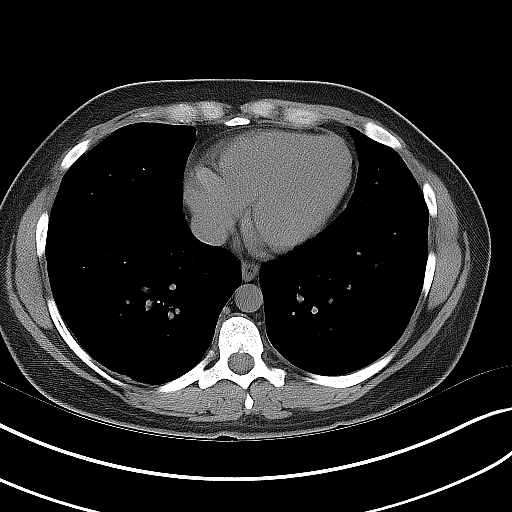
[im 23/32  lung]
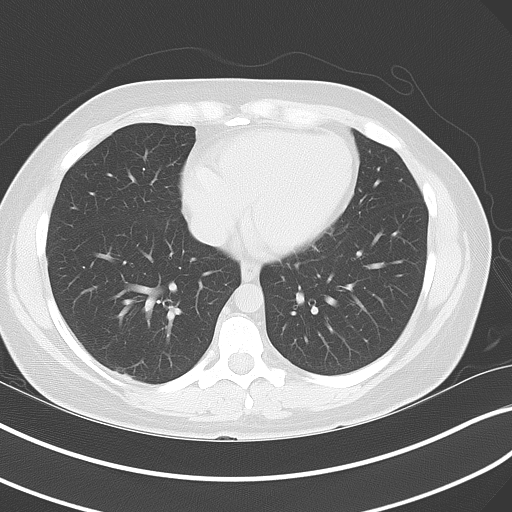
[im 27/32  soft-tissue]
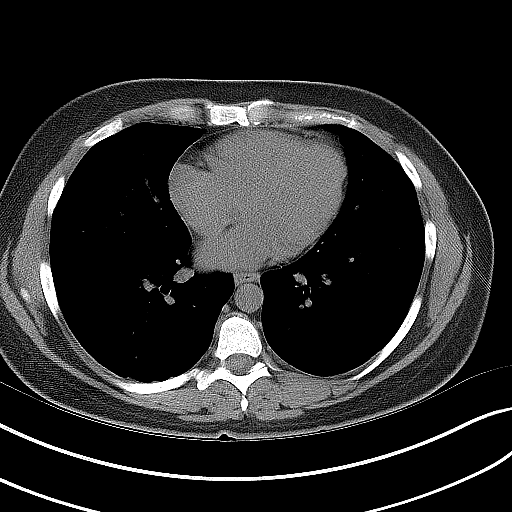
[im 27/32  lung]
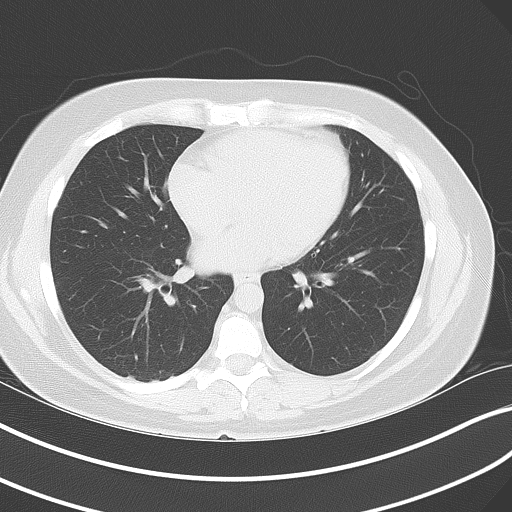

[Series 5: coronal · coronal · 0.79mm/px · 3 of 133 slices shown]
[im 45/133  soft-tissue]
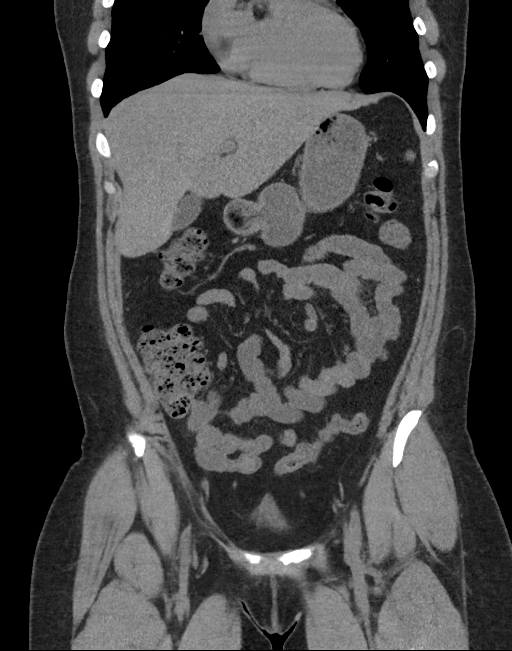
[im 59/133  soft-tissue]
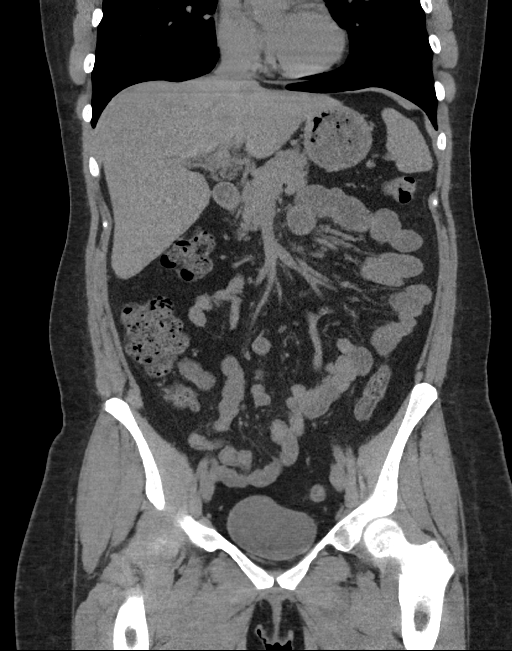
[im 74/133  soft-tissue]
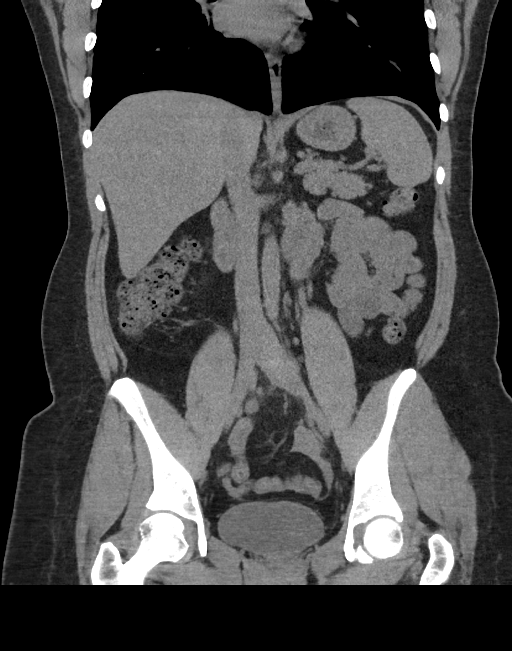

[Series 6: sagittal · sagittal · 0.52mm/px · 1 of 203 slices shown]
[im 68/203  soft-tissue]
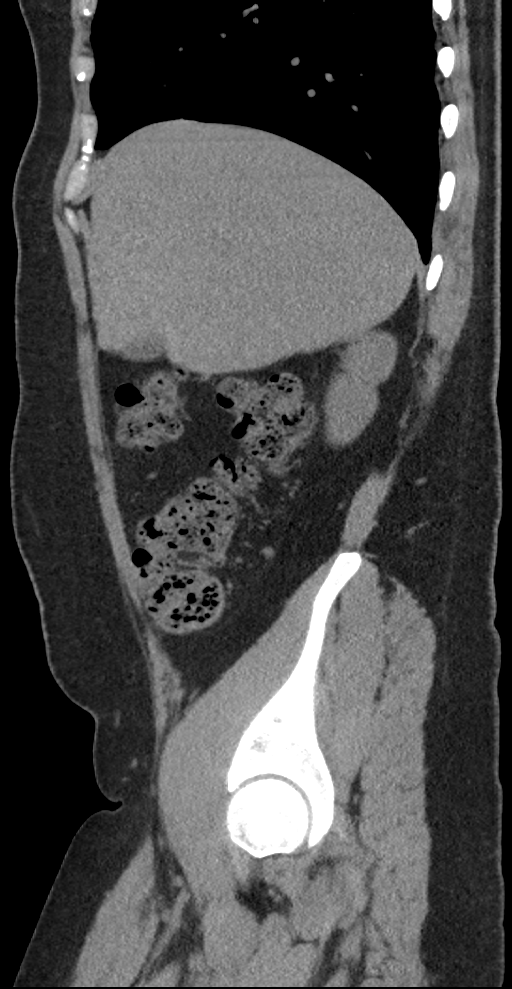

[10 of 46 positions shown; findings below may reference images not displayed]

FINDINGS: Lower chest: The visualized lung bases are grossly clear. The
visualized portions of the mediastinum are unremarkable.

Hepatobiliary: The liver is unremarkable in appearance. The
gallbladder is unremarkable in appearance. The common bile duct
remains normal in caliber.

Pancreas: The pancreas is within normal limits.

Spleen: The spleen is unremarkable in appearance.

Adrenals/Urinary Tract: The adrenal glands are unremarkable in
appearance. The kidneys are within normal limits. There is no
evidence of hydronephrosis. No renal or ureteral stones are
identified. No perinephric stranding is seen.

Stomach/Bowel: The stomach is unremarkable in appearance. The small
bowel is within normal limits. The appendix is normal in caliber,
without evidence of appendicitis. The colon is unremarkable in
appearance.

Vascular/Lymphatic: The abdominal aorta is unremarkable in
appearance. The inferior vena cava is grossly unremarkable. No
retroperitoneal lymphadenopathy is seen. No pelvic sidewall
lymphadenopathy is identified.

Reproductive: The bladder is mildly distended and within normal
limits. The uterus is grossly unremarkable in appearance. The
ovaries are relatively symmetric. No suspicious adnexal masses are
seen.

Other: No additional soft tissue abnormalities are seen.

Musculoskeletal: No acute osseous abnormalities are identified. The
visualized musculature is unremarkable in appearance.
IMPRESSION: Unremarkable noncontrast CT of the abdomen and pelvis.

## 2018-03-12 ENCOUNTER — Encounter: Payer: Self-pay | Admitting: Intensive Care

## 2018-03-12 ENCOUNTER — Emergency Department
Admission: EM | Admit: 2018-03-12 | Discharge: 2018-03-12 | Disposition: A | Discharge status: Home / Self Care | Payer: BLUE CROSS/BLUE SHIELD | Attending: Emergency Medicine | Admitting: Emergency Medicine

## 2018-03-12 ENCOUNTER — Other Ambulatory Visit: Payer: Self-pay

## 2018-03-12 DIAGNOSIS — B349 Viral infection, unspecified: Secondary | ICD-10-CM | POA: Diagnosis not present

## 2018-03-12 DIAGNOSIS — E079 Disorder of thyroid, unspecified: Secondary | ICD-10-CM | POA: Insufficient documentation

## 2018-03-12 DIAGNOSIS — R509 Fever, unspecified: Secondary | ICD-10-CM | POA: Diagnosis present

## 2018-03-12 LAB — BASIC METABOLIC PANEL
ANION GAP: 10 (ref 5–15)
BUN: 8 mg/dL (ref 6–20)
CHLORIDE: 104 mmol/L (ref 98–111)
CO2: 24 mmol/L (ref 22–32)
Calcium: 9.3 mg/dL (ref 8.9–10.3)
Creatinine, Ser: 0.72 mg/dL (ref 0.44–1.00)
GFR calc non Af Amer: >60 mL/min (ref >60)
Glucose, Bld: 91 mg/dL (ref 70–99)
Potassium: 3.6 mmol/L (ref 3.5–5.1)
SODIUM: 138 mmol/L (ref 135–145)

## 2018-03-12 LAB — CBC WITH DIFFERENTIAL/PLATELET
ABS IMMATURE GRANULOCYTES: 0.04 10*3/uL (ref 0.00–0.07)
BASOS ABS: 0 10*3/uL (ref 0.0–0.1)
Basophils Relative: 0 %
Eosinophils Absolute: 0.1 10*3/uL (ref 0.0–0.5)
Eosinophils Relative: 1 %
HEMATOCRIT: 43.4 % (ref 36.0–46.0)
Hemoglobin: 15 g/dL (ref 12.0–15.0)
IMMATURE GRANULOCYTES: 0 %
LYMPHS ABS: 2.5 10*3/uL (ref 0.7–4.0)
LYMPHS PCT: 23 %
MCH: 29.9 pg (ref 26.0–34.0)
MCHC: 34.6 g/dL (ref 30.0–36.0)
MCV: 86.5 fL (ref 80.0–100.0)
Monocytes Absolute: 0.4 10*3/uL (ref 0.1–1.0)
Monocytes Relative: 4 %
NEUTROS ABS: 7.7 10*3/uL (ref 1.7–7.7)
NEUTROS PCT: 72 %
NRBC: 0 % (ref 0.0–0.2)
Platelets: 314 10*3/uL (ref 150–400)
RBC: 5.02 MIL/uL (ref 3.87–5.11)
RDW: 12.7 % (ref 11.5–15.5)
WBC: 10.7 10*3/uL — ABNORMAL HIGH (ref 4.0–10.5)

## 2018-03-12 LAB — POCT PREGNANCY, URINE: Preg Test, Ur: NEGATIVE

## 2018-03-12 MED ORDER — SODIUM CHLORIDE 0.9 % IV BOLUS
1000.0000 mL | Freq: Once | INTRAVENOUS | Status: AC
  Administered 2018-03-12: 1000 mL via INTRAVENOUS

## 2018-03-12 MED ORDER — PROCHLORPERAZINE EDISYLATE 10 MG/2ML IJ SOLN
10.0000 mg | Freq: Once | INTRAMUSCULAR | Status: AC
  Administered 2018-03-12: 10 mg via INTRAVENOUS
  Filled 2018-03-12: qty 2

## 2018-03-12 MED ORDER — KETOROLAC TROMETHAMINE 30 MG/ML IJ SOLN
30.0000 mg | Freq: Once | INTRAMUSCULAR | Status: AC
  Administered 2018-03-12: 30 mg via INTRAVENOUS
  Filled 2018-03-12: qty 1

## 2018-03-12 MED ORDER — KETOROLAC TROMETHAMINE 10 MG PO TABS: 10.0000 mg | ORAL_TABLET | Freq: Three times a day (TID) | ORAL | 0 refills | Status: DC | PRN

## 2018-03-12 MED ORDER — PROCHLORPERAZINE MALEATE 10 MG PO TABS: 10.0000 mg | ORAL_TABLET | Freq: Three times a day (TID) | ORAL | 0 refills | Status: AC | PRN

## 2018-03-12 NOTE — ED Triage Notes (Signed)
First Nurse Note:  [facility] fever and neck pain.  [facility].  Patient states last took 2 ES Tylenol at 1200 for fever.  States fever started last night.

## 2018-03-12 NOTE — ED Provider Notes (Signed)
[facility] Emergency Department Provider Note  __________________________________________   I have reviewed the triage vital signs and the nursing notes.   HISTORY  Chief Complaint Flu like symptoms  History limited by: Not Limited   HPI MARDI CANNADY is a 32 y.o. female who presents to the emergency department today because of concerns for flulike symptoms.  The patient states this started a couple days ago with a sore throat.  She is then developed fevers, headaches, muscle aches as well as neck pain.  The patient has taken some Tylenol for the fevers although that is not significantly helped her discomfort.  The patient states that she initially went to walk-in clinic however given concerns for fever and neck pain they sent her here for possible meningitis.  Patient denies any recent incarcerations, dormitory style living, known sick contacts or immunosuppressive medication.   Per medical record review patient has a history of thyroid disease  Past Medical History:  Diagnosis Date  . Endometriosis   . Thyroid disease     Patient Active Problem List   Diagnosis Date Noted  . Gestational hypertension 06/18/2017  . Gestational hypertension w/o significant proteinuria in 3rd trimester 06/13/2017  . Sepsis (HCC) 08/18/2016  . Pelvic pain 08/07/2016    Past Surgical History:  Procedure Laterality Date  . LAPAROSCOPIC BILATERAL SALPINGO OOPHERECTOMY Left 08/07/2016   Procedure: LAPAROSCOPIC LEFT  SALPINGO OOPHORECTOMY;  Surgeon: Ward, Elenora Fender, MD;  Location: [facility] ORS;  Service: Gynecology;  Laterality: Left;  . LAPAROSCOPY N/A 08/07/2016   Procedure: LAPAROSCOPY OPERATIVE;  Surgeon: Ward, Elenora Fender, MD;  Location: [facility] ORS;  Service: Gynecology;  Laterality: N/A;  . TONSILLECTOMY      Prior to Admission medications   Not on File    Allergies Prednisone  History reviewed. No pertinent family history.  Social History Social History   Tobacco  Use  . Smoking status: Never Smoker  . Smokeless tobacco: Never Used  Substance Use Topics  . Alcohol use: Yes    Comment: Occasionally  . Drug use: No    Review of Systems Constitutional: Positive for fevers. Eyes: No visual changes. ENT: Positive for sore throat, positive for ear pain.  Cardiovascular: Denies chest pain. Respiratory: Denies shortness of breath. Gastrointestinal: No abdominal pain. Positive for nausea.  Genitourinary: Negative for dysuria. Musculoskeletal: Positive for muscle aches, neck pain.  Skin: Negative for rash. Neurological: Positive for headache.  ____________________________________________   PHYSICAL EXAM:  VITAL SIGNS: ED Triage Vitals  Enc Vitals Group     BP 03/12/18 1557 128/85     Pulse Rate 03/12/18 1557 82     Resp 03/12/18 1557 16     Temp 03/12/18 1557 98.1 F (36.7 C)     Temp Source 03/12/18 1557 Oral     SpO2 03/12/18 1557 98 %     Weight 03/12/18 1558 178 lb (80.7 kg)     Height 03/12/18 1558 5\' 6"  (1.676 m)     Head Circumference --      Peak Flow --      Pain Score 03/12/18 1558 8   Constitutional: Alert and oriented.  Eyes: Conjunctivae are normal.  ENT      Head: Normocephalic and atraumatic.      Nose: No congestion/rhinnorhea.      Mouth/Throat: Mucous membranes are moist.      Neck: No stridor. Hematological/Lymphatic/Immunilogical: No cervical lymphadenopathy. Cardiovascular: Normal rate, regular rhythm.  No murmurs, rubs, or gallops. Respiratory: Normal respiratory effort without  tachypnea nor retractions. Breath sounds are clear and equal bilaterally. No wheezes/rales/rhonchi. Gastrointestinal: Soft and non tender. No rebound. No guarding.  Genitourinary: Deferred Musculoskeletal: Normal range of motion in all extremities. No lower extremity edema.  Neurologic:  Normal speech and language. No gross focal neurologic deficits are appreciated. Negative kernig and brudzinski Skin:  Skin is warm, dry and intact. No  rash noted. Psychiatric: Mood and affect are normal. Speech and behavior are normal. Patient exhibits appropriate insight and judgment.  ____________________________________________    LABS (pertinent positives/negatives)  BMP wnl CBC wbc 10.7, hgb 15.0, plt 314 Upreg negative ____________________________________________   EKG  None  ____________________________________________    RADIOLOGY  None  ____________________________________________   PROCEDURES  Procedures  ____________________________________________   INITIAL IMPRESSION / ASSESSMENT AND PLAN / ED COURSE  Pertinent labs & imaging results that were available during my care of the patient were reviewed by me and considered in my medical decision making (see chart for details).   Patient presented to the emergency department today because of concerns for flulike symptoms as well as possible meningitis.  On exam patient had negative Kernig's and Brudzinski signs.  She did however appear uncomfortable.  At this point I had a discussion with patient bout possible meningitis.  Discussed that we could perform lumbar puncture however I had low suspicion for bacterial meningitis.  We did discuss possibility of viral meningitis.  This point patient felt comfortable deferring lumbar puncture which I think is completely reasonable.  Did give patient IV fluids and medication which did help the patient's symptoms.  At this point I think viral syndrome likely.  Did discuss return precautions.   ____________________________________________   FINAL CLINICAL IMPRESSION(S) / ED DIAGNOSES  Final diagnoses:  Viral syndrome     Note: This dictation was prepared with Dragon dictation. Any transcriptional errors that result from this process are unintentional     Phineas SemenGoodman, Travoris Bushey, MD 03/12/18 2052

## 2018-03-12 NOTE — ED Triage Notes (Signed)
Flu like symptoms on Saturday. Last night fever of 101. Woke up today with stiff neck, body aches, and ear pain. Last took tylenol today around noon for pain.

## 2018-03-12 NOTE — Discharge Instructions (Addendum)
Please seek medical attention for any high fevers, chest pain, shortness of breath, change in behavior, persistent vomiting, bloody stool or any other new or concerning symptoms.  

## 2018-05-19 ENCOUNTER — Encounter (HOSPITAL_COMMUNITY): Payer: Self-pay | Admitting: Student

## 2018-05-19 ENCOUNTER — Other Ambulatory Visit: Payer: Self-pay

## 2018-05-19 ENCOUNTER — Emergency Department (HOSPITAL_COMMUNITY): Payer: BLUE CROSS/BLUE SHIELD

## 2018-05-19 ENCOUNTER — Emergency Department (HOSPITAL_COMMUNITY)
Admission: EM | Admit: 2018-05-19 | Discharge: 2018-05-19 | Disposition: A | Discharge status: Home / Self Care | Payer: BLUE CROSS/BLUE SHIELD | Attending: Emergency Medicine | Admitting: Emergency Medicine

## 2018-05-19 DIAGNOSIS — E079 Disorder of thyroid, unspecified: Secondary | ICD-10-CM | POA: Insufficient documentation

## 2018-05-19 DIAGNOSIS — J0100 Acute maxillary sinusitis, unspecified: Secondary | ICD-10-CM | POA: Insufficient documentation

## 2018-05-19 DIAGNOSIS — R05 Cough: Secondary | ICD-10-CM | POA: Diagnosis present

## 2018-05-19 DIAGNOSIS — R059 Cough, unspecified: Secondary | ICD-10-CM

## 2018-05-19 MED ORDER — ALBUTEROL SULFATE HFA 108 (90 BASE) MCG/ACT IN AERS: 1.0000 | INHALATION_SPRAY | Freq: Four times a day (QID) | RESPIRATORY_TRACT | 0 refills | Status: AC | PRN

## 2018-05-19 MED ORDER — AMOXICILLIN-POT CLAVULANATE 875-125 MG PO TABS: 1.0000 | ORAL_TABLET | Freq: Two times a day (BID) | ORAL | 0 refills | Status: DC

## 2018-05-19 MED ORDER — SALINE SPRAY 0.65 % NA SOLN: 1.0000 | NASAL | 0 refills | Status: AC | PRN

## 2018-05-19 MED ORDER — BENZONATATE 100 MG PO CAPS: 100.0000 mg | ORAL_CAPSULE | Freq: Three times a day (TID) | ORAL | 0 refills | Status: DC | PRN

## 2018-05-19 NOTE — Discharge Instructions (Addendum)
You are seen in the emergency department today for upper respiratory infection type symptoms.  Your chest x-ray was normal.  We are treating you for a bacterial sinus infection with Augmentin, this is an antibiotic.  We are also sending you home with Tessalon for cough, nasal saline spray for congestion, and inhaler to use as needed for shortness of breath and wheezing.  We have prescribed you new medication(s) today. Discuss the medications prescribed today with your pharmacist as they can have adverse effects and interactions with your other medicines including over the counter and prescribed medications. Seek medical evaluation if you start to experience new or abnormal symptoms after taking one of these medicines, seek care immediately if you start to experience difficulty breathing, feeling of your throat closing, facial swelling, or rash as these could be indications of a more serious allergic reaction  Follow-up with primary care in the next 3 to 4 days.  Return to the ER for new or worsening symptoms or any other concerns.

## 2018-05-19 NOTE — ED Triage Notes (Signed)
Pt reports having the flu during the week of 2/14. Pt reports on 2/17 shortness of breath, coughing, and fever began. Pt denies N/V. Pt reports headache.

## 2018-05-19 NOTE — ED Provider Notes (Signed)
[facility] EMERGENCY DEPARTMENT Provider Note   CSN: [id number] Arrival date & time: 05/19/18  1800    History   Chief Complaint Chief Complaint  Patient presents with  . Cough    HPI Angela Clark is a 33 y.o. female with a hx of endometriosis and prior tonsillectomy who presents to the ED with complaints of URI sxs w/ cough that have been progressively worsening x 1 week.  Patient states that she was diagnosed with influenza 02/11 when experiencing congestion, generalized body aches, and fever/chills.  She was placed on Tamiflu and completed her 5-day course with improvement of flulike symptoms initially including congestion.  She states that over the past 1 week since completing Tamiflu she has started to have progressively worsening congestion, left maxillary sinus pressure/pain, L ear pain, dry cough, wheezing, and  shortness of breath further described as feeling of poor air movement.  Also notes some subjective fevers over the past few days.  Has had some chest pain with coughing, otherwise none.  No other specific alleviating or aggravating factors. Denies vomiting, abdominal pain, diarrhea, or leg swelling. Denies chance of pregnancy.      HPI  Past Medical History:  Diagnosis Date  . Endometriosis   . Thyroid disease     Patient Active Problem List   Diagnosis Date Noted  . Gestational hypertension 06/18/2017  . Gestational hypertension w/o significant proteinuria in 3rd trimester 06/13/2017  . Sepsis (HCC) 08/18/2016  . Pelvic pain 08/07/2016    Past Surgical History:  Procedure Laterality Date  . LAPAROSCOPIC BILATERAL SALPINGO OOPHERECTOMY Left 08/07/2016   Procedure: LAPAROSCOPIC LEFT  SALPINGO OOPHORECTOMY;  Surgeon: Ward, Elenora Fender, MD;  Location: [facility] ORS;  Service: Gynecology;  Laterality: Left;  . LAPAROSCOPY N/A 08/07/2016   Procedure: LAPAROSCOPY OPERATIVE;  Surgeon: Ward, Elenora Fender, MD;  Location: [facility] ORS;  Service: Gynecology;   Laterality: N/A;  . TONSILLECTOMY       OB History    Gravida  2   Para  2   Term  2   Preterm      AB      Living  2     SAB      TAB      Ectopic      Multiple  0   Live Births  2            Home Medications    Prior to Admission medications   Medication Sig Start Date End Date Taking? Authorizing Provider  ketorolac (TORADOL) 10 MG tablet Take 1 tablet (10 mg total) by mouth every 8 (eight) hours as needed for severe pain. 03/12/18   Phineas Semen, MD  prochlorperazine (COMPAZINE) 10 MG tablet Take 1 tablet (10 mg total) by mouth every 8 (eight) hours as needed for nausea. 03/12/18   Phineas Semen, MD    Family History No family history on file.  Social History Social History   Tobacco Use  . Smoking status: Never Smoker  . Smokeless tobacco: Never Used  Substance Use Topics  . Alcohol use: Yes    Comment: Occasionally  . Drug use: No     Allergies   Prednisone   Review of Systems Review of Systems  Constitutional: Positive for fever.  HENT: Positive for congestion, ear pain (left), sinus pressure and sinus pain. Negative for sore throat.   Respiratory: Positive for cough, shortness of breath and wheezing.   Cardiovascular: Positive for chest pain (w/ coughing, otherwise none).  Gastrointestinal: Negative for abdominal pain, diarrhea and vomiting.  Neurological: Negative for syncope.     Physical Exam Updated Vital Signs BP 125/75   Pulse 83   Temp 98.3 F (36.8 C) (Oral)   Resp 16   Ht 5\' 6"  (1.676 m)   Wt 81.6 kg   SpO2 98%   BMI 29.05 kg/m   Physical Exam Vitals signs and nursing note reviewed.  Constitutional:      General: She is not in acute distress.    Appearance: She is well-developed.  HENT:     Head: Normocephalic and atraumatic.     Right Ear: Tympanic membrane, ear canal and external ear normal. Tympanic membrane is not perforated, erythematous, retracted or bulging.     Left Ear: Tympanic membrane,  ear canal and external ear normal. Tympanic membrane is not perforated, erythematous, retracted or bulging.     Ears:     Comments: No mastoid erythema, swelling, or tenderness.    Nose: Congestion present.     Comments: Left maxillary sinus tenderness to palpation.    Mouth/Throat:     Pharynx: Uvula midline. No oropharyngeal exudate or posterior oropharyngeal erythema.  Eyes:     General:        Right eye: No discharge.        Left eye: No discharge.     Conjunctiva/sclera: Conjunctivae normal.     Pupils: Pupils are equal, round, and reactive to light.  Neck:     Musculoskeletal: Normal range of motion and neck supple. No neck rigidity.  Cardiovascular:     Rate and Rhythm: Normal rate and regular rhythm.     Heart sounds: No murmur.  Pulmonary:     Effort: Pulmonary effort is normal. No respiratory distress.     Breath sounds: Normal breath sounds. No stridor. No wheezing, rhonchi or rales.     Comments: Mild anterior chest wall tenderness to palpation. no overlying skin change or palpable crepitus. Chest:     Chest wall: No tenderness.  Abdominal:     General: There is no distension.     Palpations: Abdomen is soft.     Tenderness: There is no abdominal tenderness. There is no guarding or rebound.  Musculoskeletal:     Right lower leg: No edema.     Left lower leg: No edema.  Lymphadenopathy:     Cervical: No cervical adenopathy.  Skin:    General: Skin is warm and dry.     Findings: No rash.  Neurological:     Mental Status: She is alert.  Psychiatric:        Behavior: Behavior normal.      ED Treatments / Results  Labs (all labs ordered are listed, but only abnormal results are displayed) Labs Reviewed - No data to display  EKG None  Radiology Dg Chest 2 View  Result Date: 05/19/2018 CLINICAL DATA:  Cough EXAM: CHEST - 2 VIEW COMPARISON:  08/18/2016 FINDINGS: Heart and mediastinal contours are within normal limits. No focal opacities or effusions. No  acute bony abnormality. IMPRESSION: No active cardiopulmonary disease. Electronically Signed   By: Charlett Nose M.D.   On: 05/19/2018 20:03    Procedures Procedures (including critical care time)  Medications Ordered in ED Medications - No data to display   Initial Impression / Assessment and Plan / ED Course  I have reviewed the triage vital signs and the nursing notes.  Pertinent labs & imaging results that were available during my care of  the patient were reviewed by me and considered in my medical decision making (see chart for details).   Patient presents to the emergency department with URI symptoms with cough that has been progressively worsening over the past 1 week in the setting of recently completing Tamiflu course for influenza.  Nontoxic-appearing, no apparent distress, vitals WNL.  Lungs clear to auscultation bilaterally, chest x-ray negative for infiltrate, doubt pneumonia.  No auscultated wheezing or evidence of respiratory distress, patient does have a history of childhood asthma, she does not have any active wheezing at this time, does not appear to be an asthma exacerbation requiring steroids, however I do feel be appropriate to provide inhaler for possible bronchitis. No pneumothorax, effusion, or edema on CXR. Chest pain reproducible with chest wall palpation. PERC negative doubt PE.  Centor 0, doubt strep.  Patient does have left maxillary sinus tenderness, and had worsening after improvement of her congestion/sinus symptoms, sxs > 7 days, feel that tx with augmentin for acute maxillary sinusitis is reasonable, will prescribe augmentin, will also provide nasal saline & tessalon w/ PCP follow up. I discussed results, treatment plan, need for follow-up, and return precautions with the patient. Provided opportunity for questions, patient confirmed understanding and is in agreement with plan.    Final Clinical Impressions(s) / ED Diagnoses   Final diagnoses:  Cough  Acute  maxillary sinusitis, recurrence not specified    ED Discharge Orders         Ordered    benzonatate (TESSALON) 100 MG capsule  3 times daily PRN     05/19/18 2035    sodium chloride (OCEAN) 0.65 % SOLN nasal spray  As needed     05/19/18 2035    albuterol (PROVENTIL HFA;VENTOLIN HFA) 108 (90 Base) MCG/ACT inhaler  Every 6 hours PRN     05/19/18 2035    amoxicillin-clavulanate (AUGMENTIN) 875-125 MG tablet  Every 12 hours     05/19/18 2035           Desmond Lope 05/19/18 2037    Charlynne Pander, MD 05/19/18 2330

## 2020-01-21 DIAGNOSIS — E079 Disorder of thyroid, unspecified: Secondary | ICD-10-CM | POA: Diagnosis not present

## 2020-01-21 DIAGNOSIS — Z01419 Encounter for gynecological examination (general) (routine) without abnormal findings: Secondary | ICD-10-CM | POA: Diagnosis not present

## 2020-03-26 ENCOUNTER — Inpatient Hospital Stay: Payer: Self-pay | Attending: Family Medicine

## 2020-03-26 ENCOUNTER — Other Ambulatory Visit: Payer: Self-pay

## 2020-03-26 DIAGNOSIS — Z23 Encounter for immunization: Secondary | ICD-10-CM | POA: Insufficient documentation

## 2020-03-26 NOTE — Progress Notes (Signed)
   Covid-19 Vaccination Clinic  Name:  Angela Clark    MRN: [id number] DOB: 1985/07/17  03/26/2020  [name] was observed post Covid-19 immunization for 15 minutes without incident. She was provided with Vaccine Information Sheet and instruction to access the V-Safe system.   [name] was instructed to call 911 with any severe reactions post vaccine: Marland Kitchen Difficulty breathing  . Swelling of face and throat  . A fast heartbeat  . A bad rash all over body  . Dizziness and weakness   Immunizations Administered    Name Date Dose VIS Date Route   Pfizer COVID-19 Vaccine 03/26/2020 10:18 AM 0.3 mL 01/15/2020 Intramuscular   Manufacturer: [employer], [employer]   Lot: [id number]   NDC: [address]-9

## 2020-05-14 ENCOUNTER — Other Ambulatory Visit (HOSPITAL_COMMUNITY): Payer: Self-pay | Admitting: Family Medicine

## 2020-05-14 DIAGNOSIS — F900 Attention-deficit hyperactivity disorder, predominantly inattentive type: Secondary | ICD-10-CM | POA: Diagnosis not present

## 2020-05-14 DIAGNOSIS — K5909 Other constipation: Secondary | ICD-10-CM | POA: Diagnosis not present

## 2020-05-14 DIAGNOSIS — Z0000 Encounter for general adult medical examination without abnormal findings: Secondary | ICD-10-CM | POA: Diagnosis not present

## 2020-05-14 DIAGNOSIS — E079 Disorder of thyroid, unspecified: Secondary | ICD-10-CM | POA: Diagnosis not present

## 2020-05-14 MED FILL — AMPHETAMINE-DEXTROAMPHETAMI: 20 | 30 days supply | Qty: 45 | Fill #0

## 2020-05-28 ENCOUNTER — Other Ambulatory Visit (HOSPITAL_COMMUNITY): Payer: Self-pay | Admitting: Family Medicine

## 2020-05-28 MED FILL — LINZESS 290 MCG CAPSULE: 290 | 30 days supply | Qty: 30 | Fill #0

## 2020-06-04 ENCOUNTER — Telehealth: Payer: Self-pay | Admitting: Physician Assistant

## 2020-06-04 ENCOUNTER — Other Ambulatory Visit: Payer: Self-pay | Admitting: Physician Assistant

## 2020-06-04 DIAGNOSIS — J019 Acute sinusitis, unspecified: Secondary | ICD-10-CM

## 2020-06-04 DIAGNOSIS — B9689 Other specified bacterial agents as the cause of diseases classified elsewhere: Secondary | ICD-10-CM

## 2020-06-04 MED ORDER — AMOXICILLIN-POT CLAVULANATE 875-125 MG PO TABS: 1.0000 | ORAL_TABLET | Freq: Two times a day (BID) | ORAL | 0 refills | Status: DC

## 2020-06-04 MED ORDER — BENZONATATE 100 MG PO CAPS: 100.0000 mg | ORAL_CAPSULE | Freq: Three times a day (TID) | ORAL | 0 refills | Status: DC | PRN

## 2020-06-04 MED FILL — AMOX TR-K CLV 875-125 MG TA: 875-125 | 10 days supply | Qty: 20 | Fill #0

## 2020-06-04 MED FILL — BENZONATATE 100 MG CAPS: 100 | 10 days supply | Qty: 30 | Fill #0

## 2020-06-04 NOTE — Progress Notes (Signed)
I have spent 5 minutes in review of e-visit questionnaire, review and updating patient chart, medical decision making and response to patient.   Akelia Husted Cody Miciah Shealy, PA-C    

## 2020-06-04 NOTE — Progress Notes (Signed)
We are sorry that you are not feeling well.  Here is how we plan to help!  Based on what you have shared with me it looks like you have sinusitis.  Sinusitis is inflammation and infection in the sinus cavities of the head.  Based on your presentation I believe you most likely have Acute Bacterial Sinusitis.  This is an infection caused by bacteria and is treated with antibiotics. I have prescribed Augmentin 875mg /125mg  one tablet twice daily with food, for 7 days. You may use an oral decongestant such as Mucinex D or if you have glaucoma or high blood pressure use plain Mucinex. Saline nasal spray help and can safely be used as often as needed for congestion.  If you develop worsening sinus pain, fever or notice severe headache and vision changes, or if symptoms are not better after completion of antibiotic, please schedule an appointment with a health care provider.    I will send in a script for a cough medication called Tessalon that you can take 3 times daily.   Sinus infections are not as easily transmitted as other respiratory infection, however we still recommend that you avoid close contact with loved ones, especially the very young and elderly.  Remember to wash your hands thoroughly throughout the day as this is the number one way to prevent the spread of infection!  Home Care:  Only take medications as instructed by your medical team.  Complete the entire course of an antibiotic.  Do not take these medications with alcohol.  A steam or ultrasonic humidifier can help congestion.  You can place a towel over your head and breathe in the steam from hot water coming from a faucet.  Avoid close contacts especially the very young and the elderly.  Cover your mouth when you cough or sneeze.  Always remember to wash your hands.  Get Help Right Away If:  You develop worsening fever or sinus pain.  You develop a severe head ache or visual changes.  Your symptoms persist after you have  completed your treatment plan.  Make sure you  Understand these instructions.  Will watch your condition.  Will get help right away if you are not doing well or get worse.  Your e-visit answers were reviewed by a board certified advanced clinical practitioner to complete your personal care plan.  Depending on the condition, your plan could have included both over the counter or prescription medications.  If there is a problem please reply  once you have received a response from your provider.  Your safety is important to .  If you have drug allergies check your prescription carefully.    You can use MyChart to ask questions about today's visit, request a non-urgent call back, or ask for a work or school excuse for 24 hours related to this e-Visit. If it has been greater than 24 hours you will need to follow up with your provider, or enter a new e-Visit to address those concerns.  You will get an e-mail in the next two days asking about your experience.  I hope that your e-visit has been valuable and will speed your recovery. Thank you for using e-visits.

## 2020-07-21 ENCOUNTER — Other Ambulatory Visit (HOSPITAL_COMMUNITY): Payer: Self-pay

## 2020-07-21 MED ORDER — LUBIPROSTONE 24 MCG PO CAPS
ORAL_CAPSULE | ORAL | 11 refills | Status: AC
Start: 2020-07-21 — End: ?
  Filled 2020-07-21: qty 60, 30d supply, fill #0

## 2020-07-24 ENCOUNTER — Other Ambulatory Visit (HOSPITAL_COMMUNITY): Payer: Self-pay

## 2020-07-24 MED FILL — Amphetamine-Dextroamphetamine Tab 20 MG: ORAL | 30 days supply | Qty: 45 | Fill #0 | Status: AC

## 2020-09-30 ENCOUNTER — Other Ambulatory Visit (HOSPITAL_COMMUNITY): Payer: Self-pay

## 2020-09-30 MED FILL — Amphetamine-Dextroamphetamine Tab 20 MG: ORAL | 30 days supply | Qty: 45 | Fill #0 | Status: AC

## 2020-10-21 ENCOUNTER — Telehealth: Payer: Self-pay | Admitting: Family Medicine

## 2020-10-21 ENCOUNTER — Other Ambulatory Visit (HOSPITAL_COMMUNITY): Payer: Self-pay

## 2020-10-21 DIAGNOSIS — L709 Acne, unspecified: Secondary | ICD-10-CM

## 2020-10-21 MED ORDER — CLINDAMYCIN PHOSPHATE 1 % EX GEL
Freq: Two times a day (BID) | CUTANEOUS | 0 refills | Status: DC
  Filled 2020-10-21: qty 30, fill #0

## 2020-10-21 MED ORDER — CLINDAMYCIN PHOS-BENZOYL PEROX 1-5 % EX GEL
Freq: Two times a day (BID) | CUTANEOUS | 0 refills | Status: AC
  Filled 2020-10-21: qty 25, 30d supply, fill #0

## 2020-10-21 NOTE — Progress Notes (Signed)
We are sorry that you are experiencing this issue.  Here is how we plan to help!  Based on what you shared with me it looks like you have uncomplicated acne.  Acne is a disorder of the hair follicles and oil glands (sebaceous glands). The sebaceous glands secrete oils to keep the skin moist.  When the glands get clogged, it can lead to pimples or cysts.  These cysts may become infected and leave scars. Acne is very common and normally occurs at puberty.  Acne is also inherited.  Your personal care plan consists of the following recommendations:  I recommend that you use a daily cleanser  You might try 2% topical salicylic acid pads or wipes.  Use the pads to daily cleanse your skin. Cetaphil twice daily or a mild gentle cleanser   I have prescribed a topical gel with an antibiotic:  Clindamycin-benzoyl peroxide gel.  This gel should be applied to the affected areas twice a day.  Be sure to read the package insert to understand potential side effects.   If excessive dryness or peeling occurs, reduce dose frequency or concentration of the topical scrubs.  If excessive stinging or burning occurs, remove the topical gel with mild soap and water and resume at a lower dose the next day.  Remember oral antibiotics and topical acne treatments may increase your sensitivity to the sun!  HOME CARE: Do not squeeze pimples because that can often lead to infections, worse acne, and scars. Use a moisturizer that contains retinoid or fruit acids that may inhibit the development of new acne lesions. Although there is not a clear link that foods can cause acne, doctors do believe that too many sweets predispose you to skin problems.  GET HELP RIGHT AWAY IF: If your acne gets worse or is not better within 10 days. If you become depressed. If you become pregnant, discontinue medications and call your OB/GYN.  MAKE SURE YOU: Understand these instructions. Will watch your condition. Will get help right away  if you are not doing well or get worse.  Thank you for choosing an e-visit.  Your e-visit answers were reviewed by a board certified advanced clinical practitioner to complete your personal care plan. Depending upon the condition, your plan could have included both over the counter or prescription medications.  Please review your pharmacy choice. Make sure the pharmacy is open so you can pick up prescription now. If there is a problem, you may contact your provider through [employer] and have the prescription routed to another pharmacy.  Your safety is important to [place]. If you have drug allergies check your prescription carefully.   For the next 24 hours you can use MyChart to ask questions about today's visit, request a non-urgent call back, or ask for a work or school excuse. You will get an email in the next two days asking about your experience. I hope that your e-visit has been valuable and will speed your recovery.   I provided 10 minutes of non face-to-face time during this encounter for chart review, medication and order placement, as well as and documentation.

## 2020-11-11 ENCOUNTER — Other Ambulatory Visit (HOSPITAL_COMMUNITY): Payer: Self-pay

## 2020-11-11 DIAGNOSIS — F900 Attention-deficit hyperactivity disorder, predominantly inattentive type: Secondary | ICD-10-CM | POA: Diagnosis not present

## 2020-11-11 DIAGNOSIS — K5909 Other constipation: Secondary | ICD-10-CM | POA: Diagnosis not present

## 2020-11-11 DIAGNOSIS — E079 Disorder of thyroid, unspecified: Secondary | ICD-10-CM | POA: Diagnosis not present

## 2020-11-11 MED ORDER — AMPHETAMINE-DEXTROAMPHETAMINE 20 MG PO TABS
ORAL_TABLET | ORAL | 0 refills | Status: AC
  Filled 2021-03-11: qty 45, 30d supply, fill #0

## 2020-11-11 MED ORDER — AMPHETAMINE-DEXTROAMPHETAMINE 20 MG PO TABS
ORAL_TABLET | ORAL | 0 refills | Status: AC
  Filled 2020-11-11: qty 45, 30d supply, fill #0

## 2020-11-11 MED ORDER — LEVOTHYROXINE SODIUM 50 MCG PO TABS
ORAL_TABLET | ORAL | 3 refills | Status: AC
  Filled 2020-11-11: qty 108, 84d supply, fill #0
  Filled 2021-03-11: qty 108, 84d supply, fill #1

## 2020-11-11 MED ORDER — AMPHETAMINE-DEXTROAMPHETAMINE 20 MG PO TABS
ORAL_TABLET | ORAL | 0 refills | Status: AC
  Filled 2021-01-29: qty 45, 30d supply, fill #0

## 2020-11-25 ENCOUNTER — Emergency Department: Payer: 59

## 2020-11-25 ENCOUNTER — Other Ambulatory Visit: Payer: Self-pay

## 2020-11-25 ENCOUNTER — Emergency Department
Admission: EM | Admit: 2020-11-25 | Discharge: 2020-11-25 | Disposition: A | Discharge status: Home / Self Care | Payer: 59 | Attending: Student in an Organized Health Care Education/Training Program | Admitting: Student in an Organized Health Care Education/Training Program

## 2020-11-25 DIAGNOSIS — R1013 Epigastric pain: Secondary | ICD-10-CM | POA: Diagnosis not present

## 2020-11-25 DIAGNOSIS — R9431 Abnormal electrocardiogram [ECG] [EKG]: Secondary | ICD-10-CM | POA: Diagnosis not present

## 2020-11-25 DIAGNOSIS — R110 Nausea: Secondary | ICD-10-CM | POA: Diagnosis not present

## 2020-11-25 DIAGNOSIS — R109 Unspecified abdominal pain: Secondary | ICD-10-CM | POA: Diagnosis not present

## 2020-11-25 LAB — URINALYSIS, COMPLETE (UACMP) WITH MICROSCOPIC
Bilirubin Urine: NEGATIVE
Glucose, UA: NEGATIVE mg/dL
Ketones, ur: 5 mg/dL — AB
Leukocytes,Ua: NEGATIVE
Nitrite: NEGATIVE
Protein, ur: NEGATIVE mg/dL
Specific Gravity, Urine: 1.008 (ref 1.005–1.030)
pH: 8 (ref 5.0–8.0)

## 2020-11-25 LAB — CBC
HCT: 43.2 % (ref 36.0–46.0)
Hemoglobin: 15.6 g/dL — ABNORMAL HIGH (ref 12.0–15.0)
MCH: 32.2 pg (ref 26.0–34.0)
MCHC: 36.1 g/dL — ABNORMAL HIGH (ref 30.0–36.0)
MCV: 89.1 fL (ref 80.0–100.0)
Platelets: 302 10*3/uL (ref 150–400)
RBC: 4.85 MIL/uL (ref 3.87–5.11)
RDW: 12.1 % (ref 11.5–15.5)
WBC: 9.4 10*3/uL (ref 4.0–10.5)
nRBC: 0 % (ref 0.0–0.2)

## 2020-11-25 LAB — BASIC METABOLIC PANEL
Anion gap: 9 (ref 5–15)
BUN: 8 mg/dL (ref 6–20)
CO2: 26 mmol/L (ref 22–32)
Calcium: 9.4 mg/dL (ref 8.9–10.3)
Chloride: 102 mmol/L (ref 98–111)
Creatinine, Ser: 0.88 mg/dL (ref 0.44–1.00)
GFR, Estimated: >60 mL/min (ref >60)
Glucose, Bld: 94 mg/dL (ref 70–99)
Potassium: 3.7 mmol/L (ref 3.5–5.1)
Sodium: 137 mmol/L (ref 135–145)

## 2020-11-25 LAB — LIPASE, BLOOD: Lipase: 26 U/L (ref 11–51)

## 2020-11-25 LAB — HEPATIC FUNCTION PANEL
ALT: 13 U/L (ref 0–44)
AST: 16 U/L (ref 15–41)
Albumin: 4.9 g/dL (ref 3.5–5.0)
Alkaline Phosphatase: 73 U/L (ref 38–126)
Bilirubin, Direct: 0.3 mg/dL — ABNORMAL HIGH (ref 0.0–0.2)
Indirect Bilirubin: 2.6 mg/dL — ABNORMAL HIGH (ref 0.3–0.9)
Total Bilirubin: 2.9 mg/dL — ABNORMAL HIGH (ref 0.3–1.2)
Total Protein: 7.8 g/dL (ref 6.5–8.1)

## 2020-11-25 LAB — POC URINE PREG, ED: Preg Test, Ur: NEGATIVE

## 2020-11-25 MED ORDER — ONDANSETRON HCL 4 MG PO TABS: 4.0000 mg | ORAL_TABLET | Freq: Every day | ORAL | 0 refills | Status: AC | PRN

## 2020-11-25 MED ORDER — KETOROLAC TROMETHAMINE 30 MG/ML IJ SOLN
30.0000 mg | Freq: Once | INTRAMUSCULAR | Status: AC
  Administered 2020-11-25: 30 mg via INTRAMUSCULAR
  Filled 2020-11-25: qty 1

## 2020-11-25 MED ORDER — ONDANSETRON 4 MG PO TBDP
4.0000 mg | ORAL_TABLET | Freq: Once | ORAL | Status: AC
  Administered 2020-11-25: 4 mg via ORAL
  Filled 2020-11-25: qty 1

## 2020-11-25 MED ORDER — HYDROCODONE-ACETAMINOPHEN 5-325 MG PO TABS: 1.0000 | ORAL_TABLET | ORAL | 0 refills | Status: DC | PRN

## 2020-11-25 MED ORDER — HYDROCODONE-ACETAMINOPHEN 5-325 MG PO TABS
1.0000 | ORAL_TABLET | Freq: Once | ORAL | Status: AC
  Administered 2020-11-25: 1 via ORAL
  Filled 2020-11-25: qty 1

## 2020-11-25 MED ORDER — PANTOPRAZOLE SODIUM 20 MG PO TBEC: 20.0000 mg | DELAYED_RELEASE_TABLET | Freq: Every day | ORAL | 0 refills | Status: AC

## 2020-11-25 NOTE — ED Provider Notes (Signed)
[facility] Emergency Department Provider Note    Event Date/Time   First MD Initiated Contact with Patient 11/25/20 1637     (approximate)  I have reviewed the triage vital signs and the nursing notes.   HISTORY  Chief Complaint Flank Pain    HPI TORUNN CHANCELLOR is a 35 y.o. female who presents to the ER for evaluation of left flank back pain radiating to the left side starting this morning.  Denies any shortness of breath or chest pain.  Is never had pain like this before.  No dysuria.  Also having some epigastric pain no nausea vomiting or diarrhea does have some nausea as the pain is waxing and waning.  No history of kidney stones.  Past Medical History:  Diagnosis Date   Endometriosis    Thyroid disease    No family history on file. Past Surgical History:  Procedure Laterality Date   LAPAROSCOPIC BILATERAL SALPINGO OOPHERECTOMY Left 08/07/2016   Procedure: LAPAROSCOPIC LEFT  SALPINGO OOPHORECTOMY;  Surgeon: Ward, Elenora Fender, MD;  Location: [facility] ORS;  Service: Gynecology;  Laterality: Left;   LAPAROSCOPY N/A 08/07/2016   Procedure: LAPAROSCOPY OPERATIVE;  Surgeon: Ward, Elenora Fender, MD;  Location: [facility] ORS;  Service: Gynecology;  Laterality: N/A;   TONSILLECTOMY     Patient Active Problem List   Diagnosis Date Noted   Gestational hypertension 06/18/2017   Gestational hypertension w/o significant proteinuria in 3rd trimester 06/13/2017   Sepsis (HCC) 08/18/2016   Pelvic pain 08/07/2016      Prior to Admission medications   Medication Sig Start Date End Date Taking? Authorizing Provider  HYDROcodone-acetaminophen (NORCO) 5-325 MG tablet Take 1 tablet by mouth every 4 (four) hours as needed for moderate pain. 11/25/20  Yes Willy Eddy, MD  ondansetron (ZOFRAN) 4 MG tablet Take 1 tablet (4 mg total) by mouth daily as needed. 11/25/20 11/25/21 Yes Willy Eddy, MD  pantoprazole (PROTONIX) 20 MG tablet Take 1 tablet (20 mg total) by mouth  daily. 11/25/20 11/25/21 Yes Willy Eddy, MD  albuterol (PROVENTIL HFA;VENTOLIN HFA) 108 [phone] Base) MCG/ACT inhaler Inhale 1-2 puffs into the lungs every 6 (six) hours as needed for wheezing or shortness of breath. 05/19/18   Petrucelli, Samantha R, PA-C  amoxicillin-clavulanate (AUGMENTIN) 875-125 MG tablet TAKE 1 TABLET BY MOUTH 2 TIMES DAILY 06/04/20 06/04/21  Waldon Merl, PA-C  amphetamine-dextroamphetamine (ADDERALL) 20 MG tablet TAKE 1 TABLET BY MOUTH EVERY MORNING AND 1/2 TABLET IN THE AFTERNOON AS NEEDED 05/14/20 11/10/20  Jerl Mina, MD  amphetamine-dextroamphetamine (ADDERALL) 20 MG tablet TAKE 1 TABLET BY MOUTH EVERY MORNING AND 1/2 TABLET IN THE AFTERNOON AS NEEDED - EFFECTIVE 06/13/20 05/14/20 11/10/20  Jerl Mina, MD  amphetamine-dextroamphetamine (ADDERALL) 20 MG tablet TAKE 1 TABLET EVERY MORNING, AND 1/2 TABLET IN THE AFTERNOON IF NEEDED - EFFECTIVE 07/13/20 05/14/20 11/10/20  Jerl Mina, MD  amphetamine-dextroamphetamine (ADDERALL) 20 MG tablet Take 1 tablet by mouth every morning and 1/2 tablet in the afternoon as needed 11/11/20     amphetamine-dextroamphetamine (ADDERALL) 20 MG tablet Take 1 tablet by mouth every morning and 1/2 tablet in the afternoon as needed (12/11/20) 12/11/20     amphetamine-dextroamphetamine (ADDERALL) 20 MG tablet Take 1 tablet by mouth every morning and 1/2 tablet in the afternoon if needed (01/10/21) 01/10/21     benzonatate (TESSALON) 100 MG capsule TAKE 1 CAPSULE BY MOUTH 3 TIMES DAILY AS NEEDED FOR COUGH 06/04/20 06/04/21  Waldon Merl, PA-C  clindamycin-benzoyl peroxide (BENZACLIN) gel Apply topically  2 (two) times daily. 10/21/20   Freddy Finner, NP  ketorolac (TORADOL) 10 MG tablet Take 1 tablet (10 mg total) by mouth every 8 (eight) hours as needed for severe pain. 03/12/18   Phineas Semen, MD  levothyroxine (SYNTHROID) 50 MCG tablet Alternate taking 1 tablet ( ) by mouth and 1 and 1/2 tablets ( ) every other day. 11/11/20      linaclotide (LINZESS) 290 MCG CAPS capsule TAKE 1 CAPSULE BY MOUTH ONCE A DAY 05/28/20 05/28/21  Jerl Mina, MD  lubiprostone (AMITIZA) 24 MCG capsule Take 1 capsule by mouth 2 times a day with a meal 07/21/20     prochlorperazine (COMPAZINE) 10 MG tablet Take 1 tablet (10 mg total) by mouth every 8 (eight) hours as needed for nausea. 03/12/18   Phineas Semen, MD  sodium chloride (OCEAN) 0.65 % SOLN nasal spray Place 1 spray into both nostrils as needed for congestion. 05/19/18   Petrucelli, Pleas Koch, PA-C    Allergies Prednisone    Social History Social History   Tobacco Use   Smoking status: Never   Smokeless tobacco: Never  Substance Use Topics   Alcohol use: Yes    Comment: Occasionally   Drug use: No    Review of Systems Patient denies headaches, rhinorrhea, blurry vision, numbness, shortness of breath, chest pain, edema, cough, abdominal pain, nausea, vomiting, diarrhea, dysuria, fevers, rashes or hallucinations unless otherwise stated above in HPI. ____________________________________________   PHYSICAL EXAM:  VITAL SIGNS: Vitals:   11/25/20 1600  BP: (!) 130/93  Pulse: 81  Resp: 18  Temp: 98.4 F (36.9 C)  SpO2: 100%    Constitutional: Alert and oriented.  Eyes: Conjunctivae are normal.  Head: Atraumatic. Nose: No congestion/rhinnorhea. Mouth/Throat: Mucous membranes are moist.   Neck: No stridor. Painless ROM.  Cardiovascular: Normal rate, regular rhythm. Grossly normal heart sounds.  Good peripheral circulation. Respiratory: Normal respiratory effort.  No retractions. Lungs CTAB. Gastrointestinal: Soft with mild epigastric ttp. No distention. No abdominal bruits. No CVA tenderness. Genitourinary:  Musculoskeletal: No lower extremity tenderness nor edema.  No joint effusions. Neurologic:  Normal speech and language. No gross focal neurologic deficits are appreciated. No facial droop Skin:  Skin is warm, dry and intact. No rash noted. Psychiatric:  Mood and affect are normal. Speech and behavior are normal.  ____________________________________________   LABS (all labs ordered are listed, but only abnormal results are displayed)  Results for orders placed or performed during the hospital encounter of 11/25/20 (from the past 24 hour(s))  Basic metabolic panel     Status: None   Collection Time: 11/25/20  4:23 PM  Result Value Ref Range   Sodium 137 135 - 145 mmol/L   Potassium 3.7 3.5 - 5.1 mmol/L   Chloride 102 98 - 111 mmol/L   CO2 26 22 - 32 mmol/L   Glucose, Bld 94 70 - 99 mg/dL   BUN 8 6 - 20 mg/dL   Creatinine, Ser 9.67 0.44 - 1.00 mg/dL   Calcium 9.4 8.9 - 89.3 mg/dL   GFR, Estimated >81 >01 mL/min   Anion gap 9 5 - 15  CBC     Status: Abnormal   Collection Time: 11/25/20  4:23 PM  Result Value Ref Range   WBC 9.4 4.0 - 10.5 K/uL   RBC 4.85 3.87 - 5.11 MIL/uL   Hemoglobin 15.6 (H) 12.0 - 15.0 g/dL   HCT 75.1 02.5 - 85.2 %   MCV 89.1 80.0 - 100.0 fL   MCH 32.2  26.0 - 34.0 pg   MCHC 36.1 (H) 30.0 - 36.0 g/dL   RDW 52.812.1 41.311.5 - 24.415.5 %   Platelets 302 150 - 400 K/uL   nRBC 0.0 0.0 - 0.2 %  Urinalysis, Complete w Microscopic     Status: Abnormal   Collection Time: 11/25/20  4:24 PM  Result Value Ref Range   Color, Urine YELLOW (A) YELLOW   APPearance CLEAR (A) CLEAR   Specific Gravity, Urine 1.008 1.005 - 1.030   pH 8.0 5.0 - 8.0   Glucose, UA NEGATIVE NEGATIVE mg/dL   Hgb urine dipstick SMALL (A) NEGATIVE   Bilirubin Urine NEGATIVE NEGATIVE   Ketones, ur 5 (A) NEGATIVE mg/dL   Protein, ur NEGATIVE NEGATIVE mg/dL   Nitrite NEGATIVE NEGATIVE   Leukocytes,Ua NEGATIVE NEGATIVE   RBC / HPF 0-5 0 - 5 RBC/hpf   WBC, UA 0-5 0 - 5 WBC/hpf   Bacteria, UA FEW (A) NONE SEEN   Squamous Epithelial / LPF 0-5 0 - 5   Mucus PRESENT   Lipase, blood     Status: None   Collection Time: 11/25/20  4:24 PM  Result Value Ref Range   Lipase 26 11 - 51 U/L  Hepatic function panel     Status: Abnormal   Collection Time: 11/25/20   4:24 PM  Result Value Ref Range   Total Protein 7.8 6.5 - 8.1 g/dL   Albumin 4.9 3.5 - 5.0 g/dL   AST 16 15 - 41 U/L   ALT 13 0 - 44 U/L   Alkaline Phosphatase 73 38 - 126 U/L   Total Bilirubin 2.9 (H) 0.3 - 1.2 mg/dL   Bilirubin, Direct 0.3 (H) 0.0 - 0.2 mg/dL   Indirect Bilirubin 2.6 (H) 0.3 - 0.9 mg/dL  POC urine preg, ED     Status: None   Collection Time: 11/25/20  4:35 PM  Result Value Ref Range   Preg Test, Ur NEGATIVE NEGATIVE   ____________________________________________  EKG My review and personal interpretation at Time: 15:57   Indication: epigastric pain  Rate: 80  Rhythm: sinus Axis: normal Other: normal intervals, no stemi ____________________________________________  RADIOLOGY  I personally reviewed all radiographic images ordered to evaluate for the above acute complaints and reviewed radiology reports and findings.  These findings were personally discussed with the patient.  Please see medical record for radiology report.  ____________________________________________   PROCEDURES  Procedure(s) performed:  Procedures    Critical Care performed: no ____________________________________________   INITIAL IMPRESSION / ASSESSMENT AND PLAN / ED COURSE  Pertinent labs & imaging results that were available during my care of the patient were reviewed by me and considered in my medical decision making (see chart for details).   DDX: Enteritis, gastritis, stone, Pilo, pancreatitis, SBO, biliary pathology  Juanetta BeetsLaura K Verge is a 35 y.o. who presents to the ED with presentation as described above.  Patient nontoxic-appearing describing epigastric and left flank pain.  No history of stone no history of pancreatitis.  Does have some mild discomfort on exam particular left side.  Urinalysis with rare bacteria patient denies any dysuria or discharge.  Lower suspicion for UTI.  No measured fevers but did have some chills.  EKG nonischemic does not seem cardiac in  nature.  Clinical Course as of 11/25/20 1854  Wed Nov 25, 2020  1745 Patient reassessed.  Discussed results of CT imaging.  Given mildly elevated bilirubin with pain will order right upper quadrant ultrasound.  She adamantly denies any pelvic  or lower abdominal pain and therefore that is more likely her chronic displacement of IUD and patient has outpatient follow-up coming up.  I do think deferring ultrasound pelvis is appropriate at this time. [PR]  9211 Patient reassessed.  Work-up is reassuring.  She is nontoxic-appearing.  Given reassuring ultrasound I do believe patient stable and appropriate for outpatient follow-up for repeat blood work and referral.  Discussed strict return precautions.  Patient agreeable to plan. [PR]    Clinical Course User Index [PR] Willy Eddy, MD    The patient was evaluated in Emergency Department today for the symptoms described in the history of present illness. He/she was evaluated in the context of the global COVID-19 pandemic, which necessitated consideration that the patient might be at risk for infection with the SARS-CoV-2 virus that causes COVID-19. Institutional protocols and algorithms that pertain to the evaluation of patients at risk for COVID-19 are in a state of rapid change based on information released by regulatory bodies including the CDC and federal and state organizations. These policies and algorithms were followed during the patient's care in the ED.  As part of my medical decision making, I reviewed the following data within the electronic MEDICAL RECORD NUMBER Nursing notes reviewed and incorporated, Labs reviewed, notes from prior ED visits and [place] Controlled Substance Database   ____________________________________________   FINAL CLINICAL IMPRESSION(S) / ED DIAGNOSES  Final diagnoses:  Epigastric pain      NEW MEDICATIONS STARTED DURING THIS VISIT:  New Prescriptions   HYDROCODONE-ACETAMINOPHEN (NORCO) 5-325 MG TABLET    Take  1 tablet by mouth every 4 (four) hours as needed for moderate pain.   ONDANSETRON (ZOFRAN) 4 MG TABLET    Take 1 tablet (4 mg total) by mouth daily as needed.   PANTOPRAZOLE (PROTONIX) 20 MG TABLET    Take 1 tablet (20 mg total) by mouth daily.     Note:  This document was prepared using Dragon voice recognition software and may include unintentional dictation errors.    Willy Eddy, MD 11/25/20 [id number]

## 2020-11-25 NOTE — ED Triage Notes (Signed)
Pt to ED from [facility] for left sided flank pain that started this am. +nausea. Denies burning with urination or hematuria. Denies hx kidney stones.

## 2020-11-25 NOTE — Discharge Instructions (Addendum)

## 2020-12-30 ENCOUNTER — Encounter (HOSPITAL_COMMUNITY): Payer: Self-pay | Admitting: Radiology

## 2021-01-20 DIAGNOSIS — T8332XA Displacement of intrauterine contraceptive device, initial encounter: Secondary | ICD-10-CM | POA: Diagnosis not present

## 2021-01-21 ENCOUNTER — Other Ambulatory Visit (HOSPITAL_COMMUNITY): Payer: Self-pay | Admitting: Family Medicine

## 2021-01-21 DIAGNOSIS — Z01419 Encounter for gynecological examination (general) (routine) without abnormal findings: Secondary | ICD-10-CM | POA: Diagnosis not present

## 2021-01-21 DIAGNOSIS — Z30433 Encounter for removal and reinsertion of intrauterine contraceptive device: Secondary | ICD-10-CM | POA: Diagnosis not present

## 2021-01-21 DIAGNOSIS — T8332XA Displacement of intrauterine contraceptive device, initial encounter: Secondary | ICD-10-CM | POA: Diagnosis not present

## 2021-01-22 ENCOUNTER — Other Ambulatory Visit (HOSPITAL_COMMUNITY): Payer: Self-pay

## 2021-01-27 ENCOUNTER — Other Ambulatory Visit (HOSPITAL_COMMUNITY): Payer: Self-pay | Admitting: Family Medicine

## 2021-01-28 ENCOUNTER — Other Ambulatory Visit (HOSPITAL_COMMUNITY): Payer: Self-pay

## 2021-01-28 MED ORDER — CLINDAMYCIN PHOS-BENZOYL PEROX 1-5 % EX GEL
Freq: Two times a day (BID) | CUTANEOUS | 5 refills | Status: AC
  Filled 2021-01-28: qty 25, 30d supply, fill #0

## 2021-01-29 ENCOUNTER — Other Ambulatory Visit (HOSPITAL_COMMUNITY): Payer: Self-pay

## 2021-03-03 DIAGNOSIS — Z30431 Encounter for routine checking of intrauterine contraceptive device: Secondary | ICD-10-CM | POA: Diagnosis not present

## 2021-03-03 DIAGNOSIS — T8332XA Displacement of intrauterine contraceptive device, initial encounter: Secondary | ICD-10-CM | POA: Diagnosis not present

## 2021-03-03 DIAGNOSIS — N83201 Unspecified ovarian cyst, right side: Secondary | ICD-10-CM | POA: Diagnosis not present

## 2021-03-04 DIAGNOSIS — R5383 Other fatigue: Secondary | ICD-10-CM | POA: Diagnosis not present

## 2021-03-04 DIAGNOSIS — R058 Other specified cough: Secondary | ICD-10-CM | POA: Diagnosis not present

## 2021-03-04 DIAGNOSIS — R6889 Other general symptoms and signs: Secondary | ICD-10-CM | POA: Diagnosis not present

## 2021-03-04 DIAGNOSIS — R519 Headache, unspecified: Secondary | ICD-10-CM | POA: Diagnosis not present

## 2021-03-04 DIAGNOSIS — Z03818 Encounter for observation for suspected exposure to other biological agents ruled out: Secondary | ICD-10-CM | POA: Diagnosis not present

## 2021-03-11 ENCOUNTER — Other Ambulatory Visit (HOSPITAL_COMMUNITY): Payer: Self-pay

## 2021-05-24 ENCOUNTER — Other Ambulatory Visit (HOSPITAL_COMMUNITY): Payer: Self-pay

## 2021-12-04 IMAGING — US US ABDOMEN LIMITED
1 series · 14 of 25 positions shown · non-contrast
Comparison: CT scan 11/25/2020

CLINICAL DATA: Epigastric abdominal pain and nausea for 1 day

EXAM:
ULTRASOUND ABDOMEN LIMITED RIGHT UPPER QUADRANT

[Series 1: us abdomen limited ruq (liver/gb) · 14 of 144 slices shown]
[im 1/144]
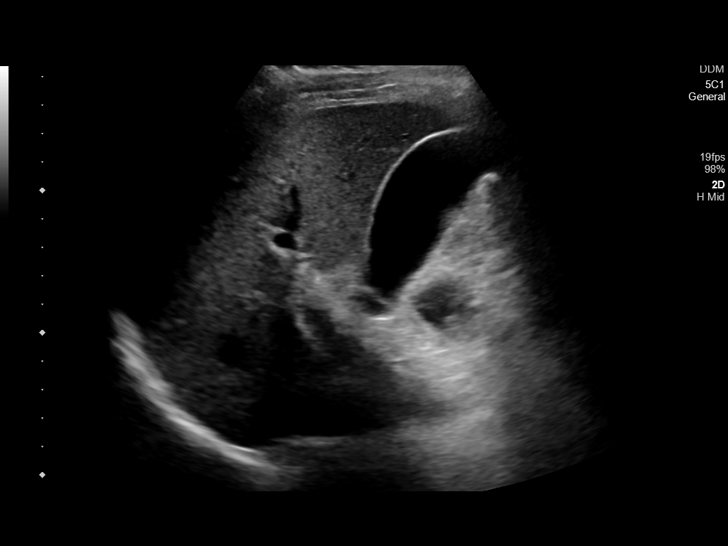
[im 12/144]
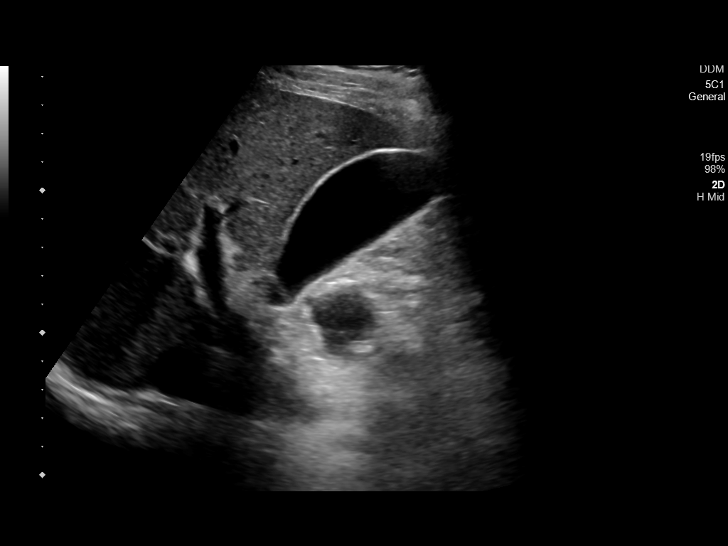
[im 24/144]
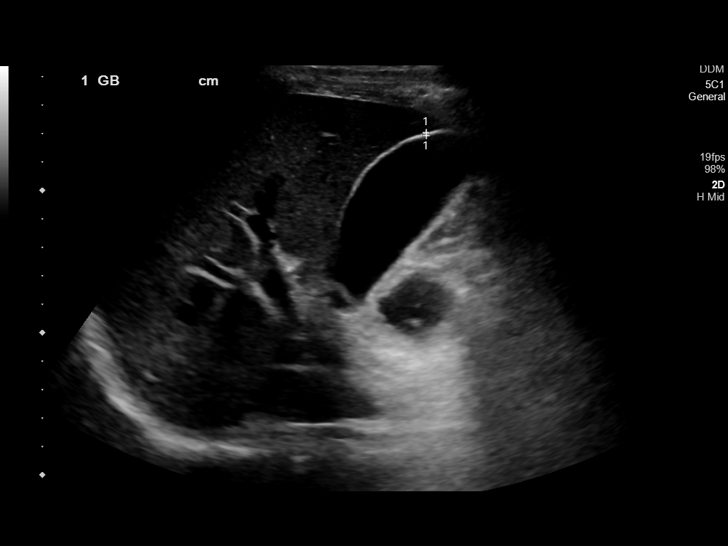
[im 36/144]
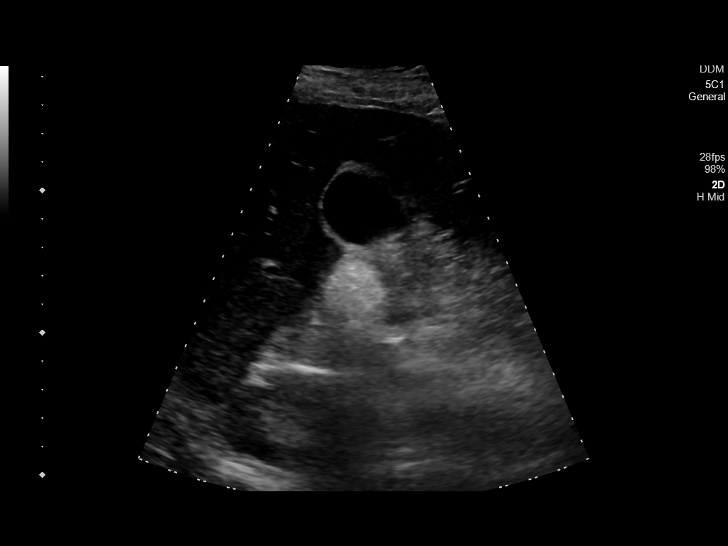
[im 48/144]
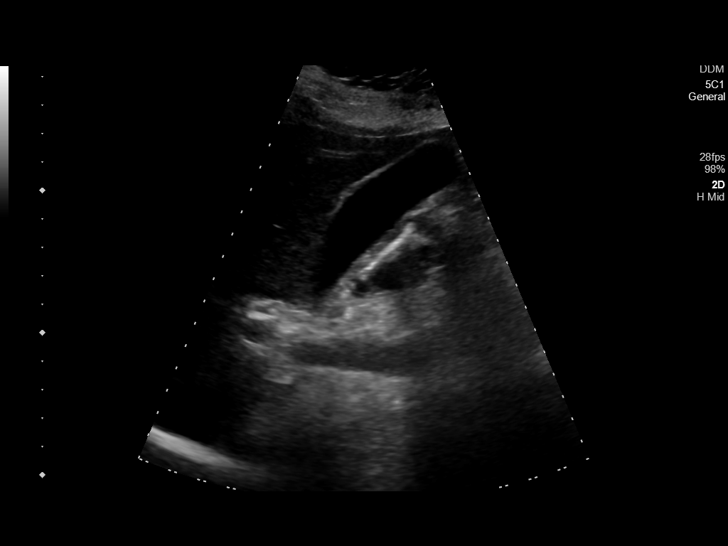
[im 54/144]
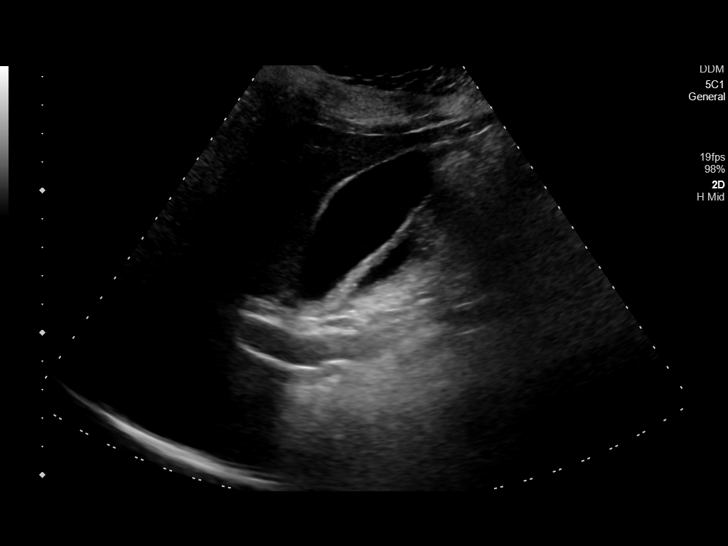
[im 66/144]
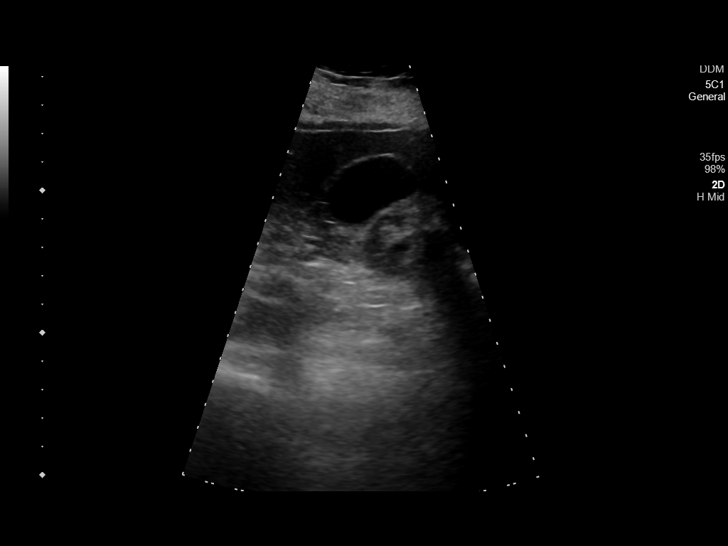
[im 78/144]
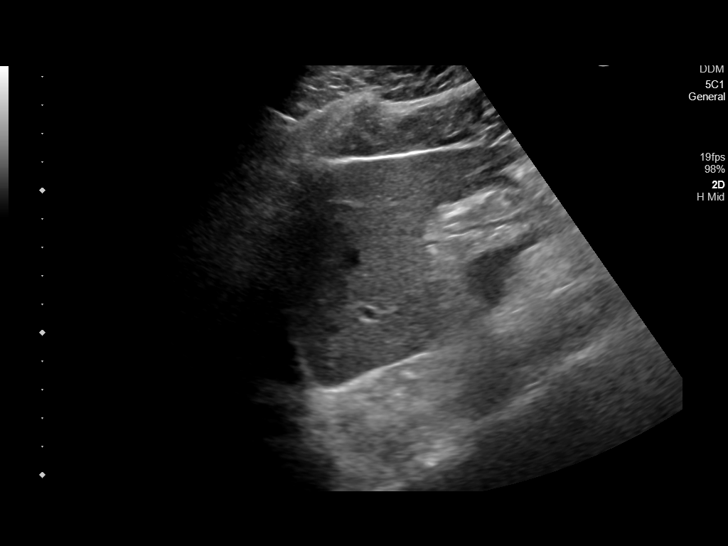
[im 90/144]
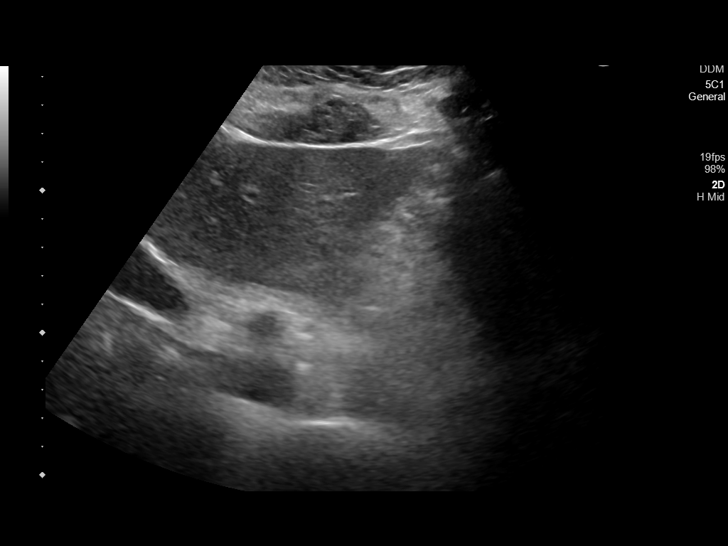
[im 96/144]
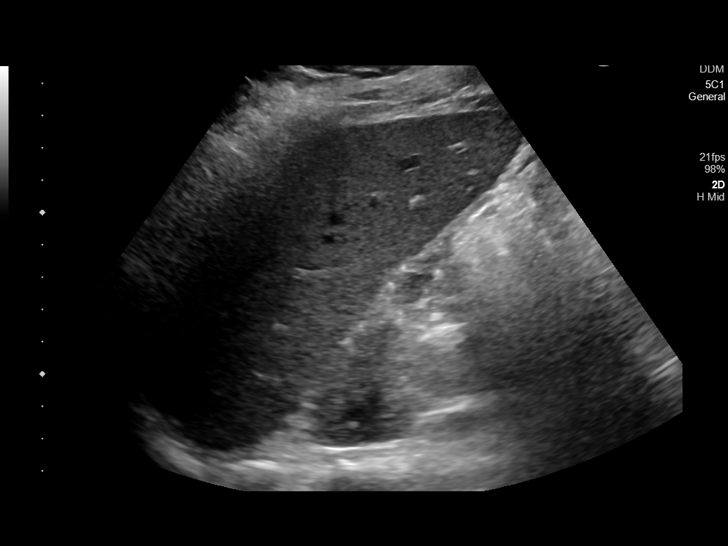
[im 108/144]
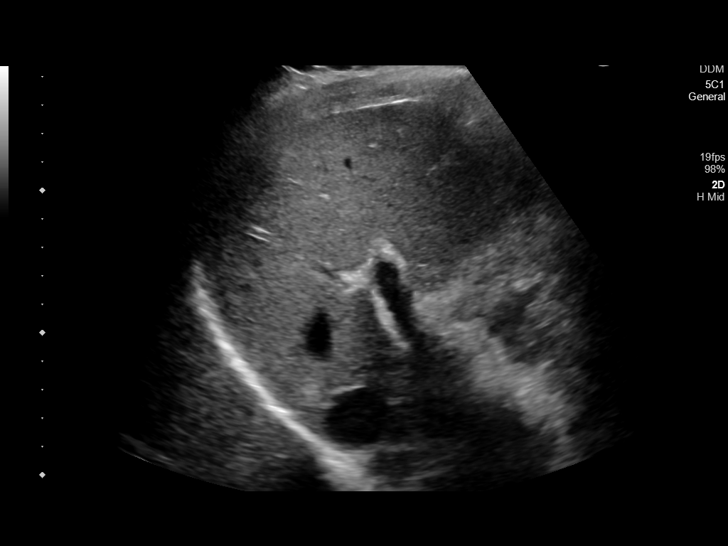
[im 120/144]
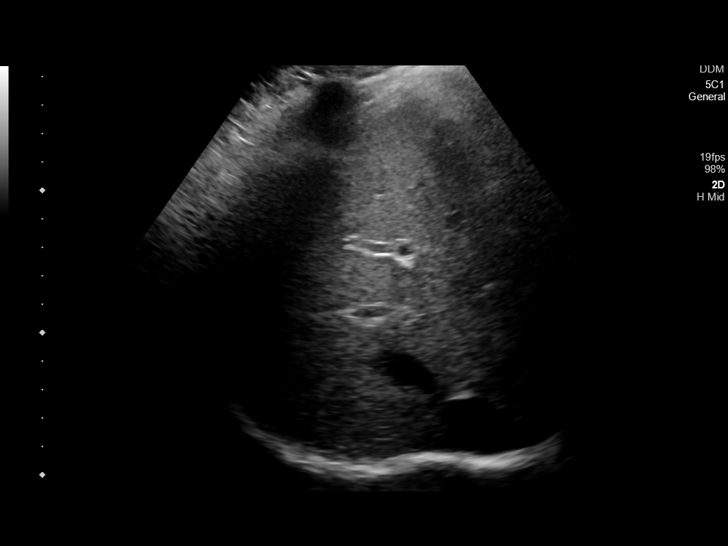
[im 132/144]
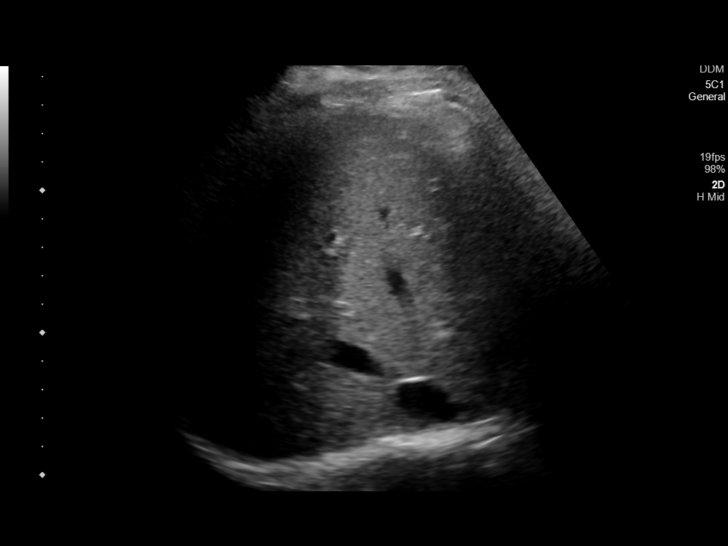
[im 144/144]
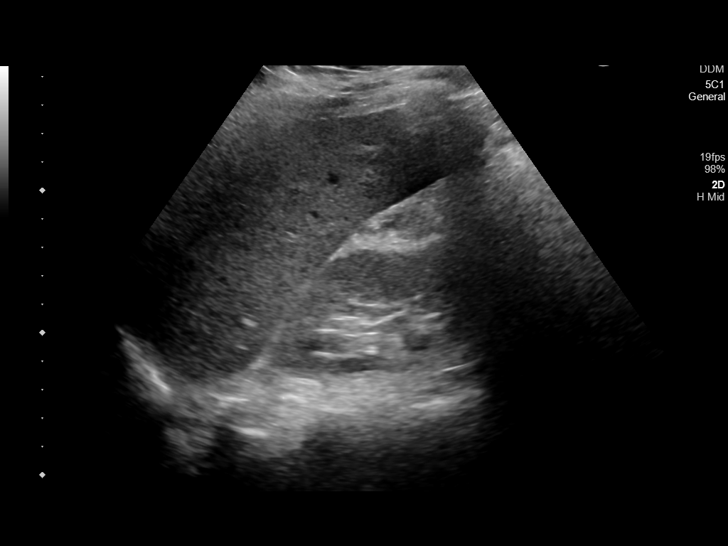

[14 of 25 positions shown; findings below may reference images not displayed]

FINDINGS: Gallbladder:

No gallstones or wall thickening visualized. No sonographic Murphy
sign noted by sonographer.

Common bile duct:

Diameter: 0.4 cm

Liver:

No focal lesion identified. Within normal limits in parenchymal
echogenicity. Portal vein is patent on color Doppler imaging with
normal direction of blood flow towards the liver.

Other: None.
IMPRESSION: Normal hepatobiliary ultrasound. No specific findings to explain the
patient's symptoms.

## 2021-12-04 IMAGING — CT CT RENAL STONE PROTOCOL
3 of 4 series · 8 of 46 positions shown, 15 images · non-contrast
Comparison: August 18, 2016

CLINICAL DATA: Left flank pain.

EXAM:
CT ABDOMEN AND PELVIS WITHOUT CONTRAST
TECHNIQUE: Multidetector CT imaging of the abdomen and pelvis was performed
following the standard protocol without IV contrast.

[Series 4: lung bases · axial · 0.62mm/px · z∈[+48,+108]mm · 4 of 22 slices shown, 9 images]
[im 5/22  soft-tissue]
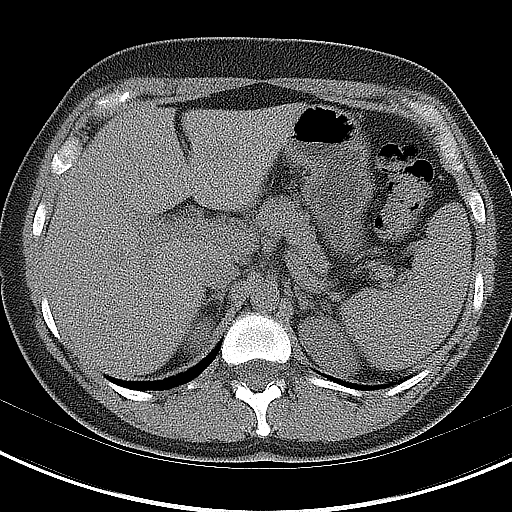
[im 5/22  lung]
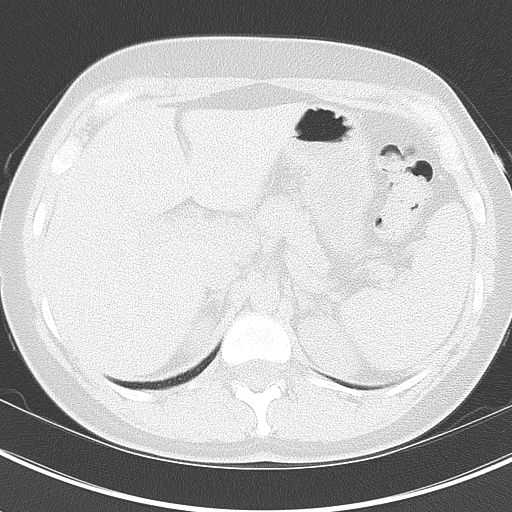
[im 5/22  bone]
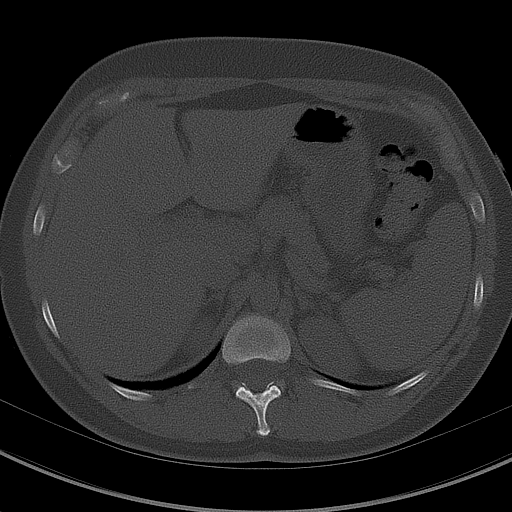
[im 9/22  soft-tissue]
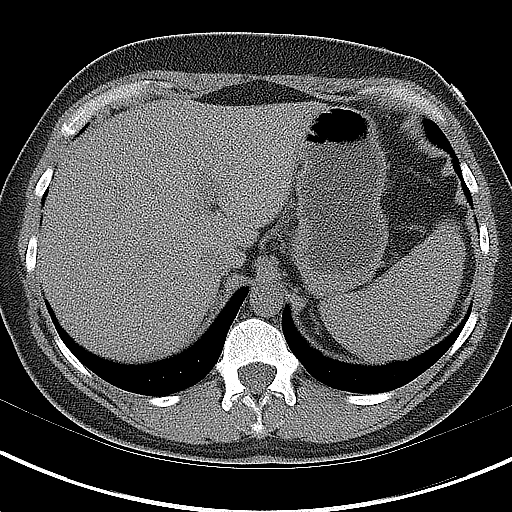
[im 9/22  lung]
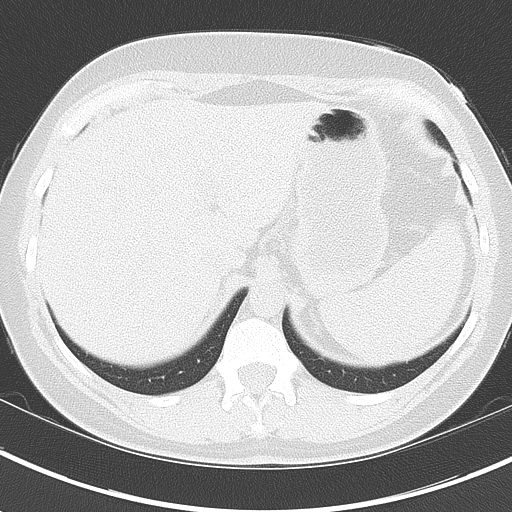
[im 13/22  soft-tissue]
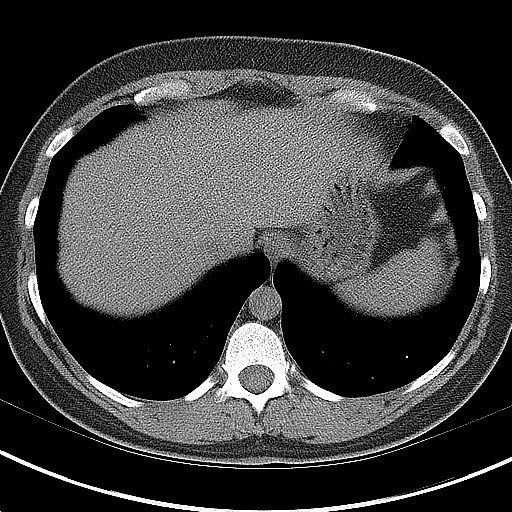
[im 13/22  lung]
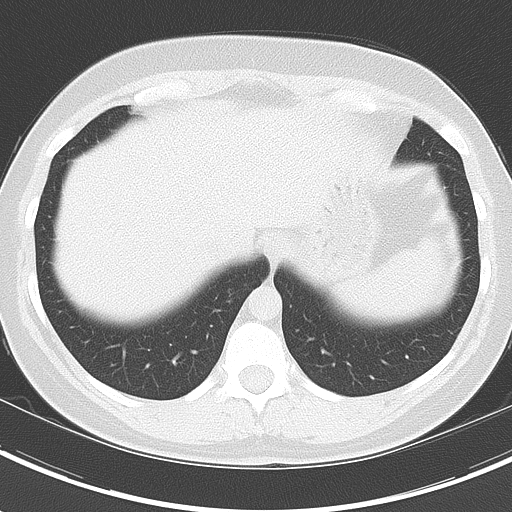
[im 17/22  soft-tissue]
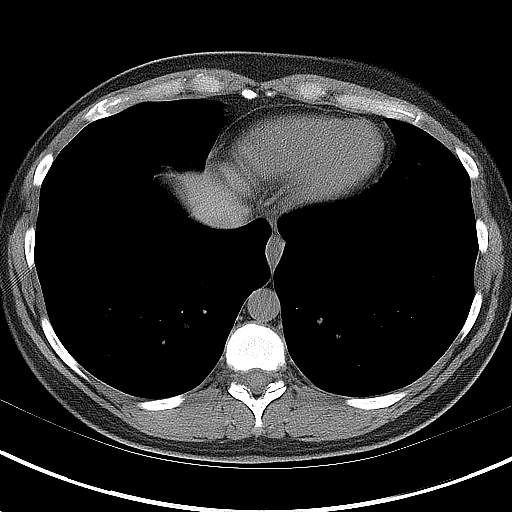
[im 17/22  lung]
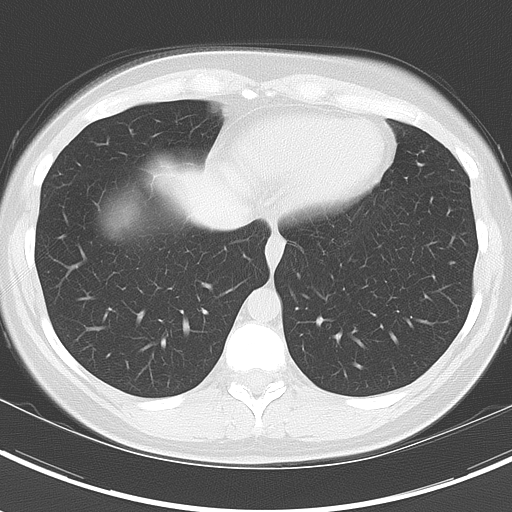

[Series 5: coronal · coronal · 0.81mm/px · 3 of 136 slices shown, 4 images]
[im 46/136  soft-tissue]
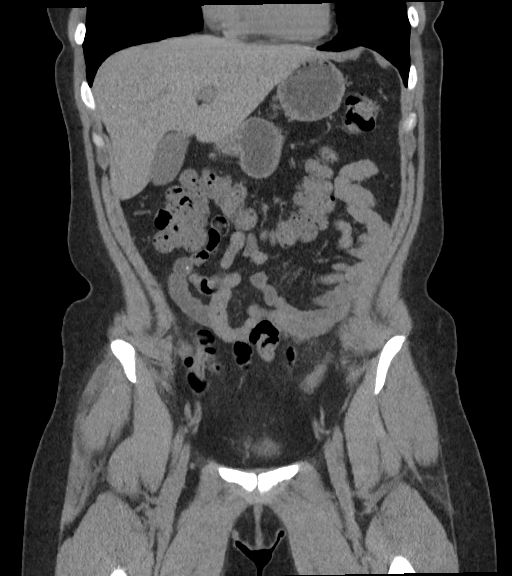
[im 61/136  soft-tissue]
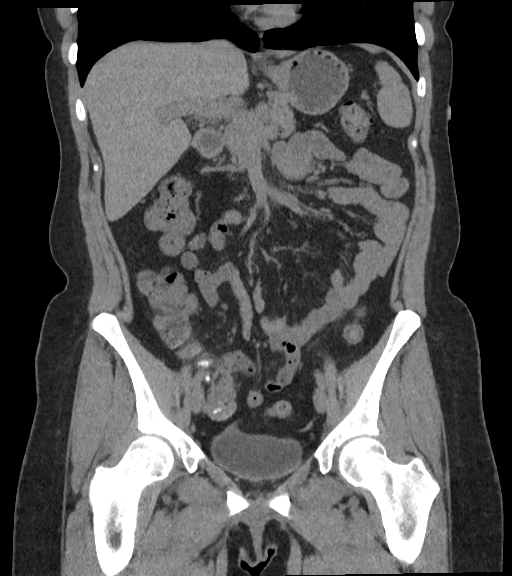
[im 61/136  bone]
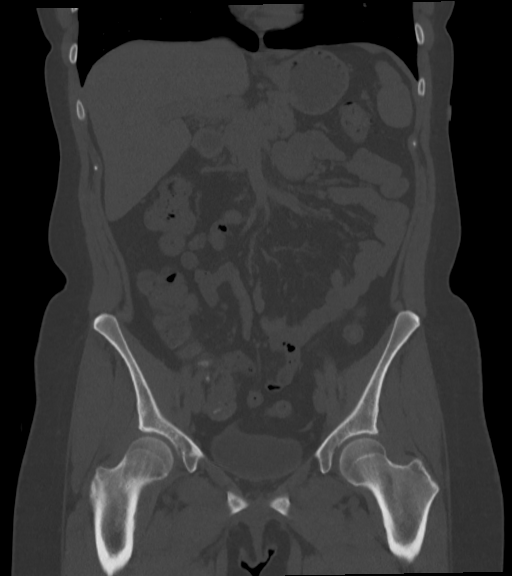
[im 76/136  soft-tissue]
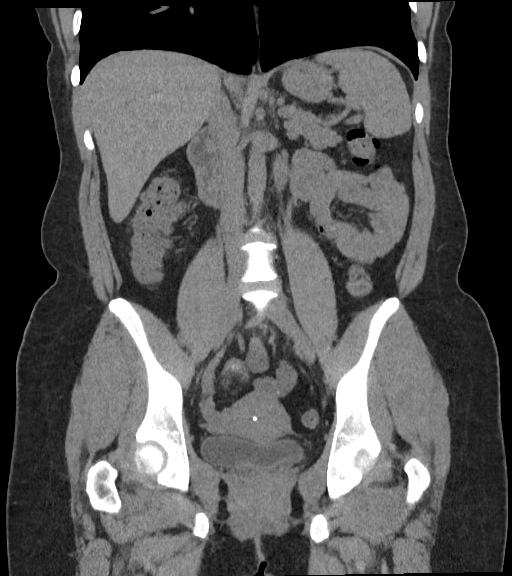

[Series 6: sagittal · sagittal · 0.53mm/px · 1 of 170 slices shown, 2 images]
[im 57/170  soft-tissue]
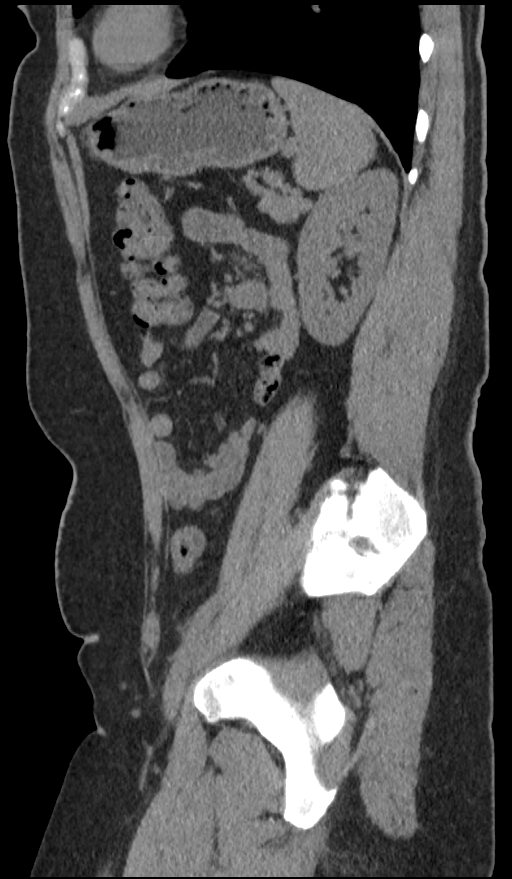
[im 57/170  bone]
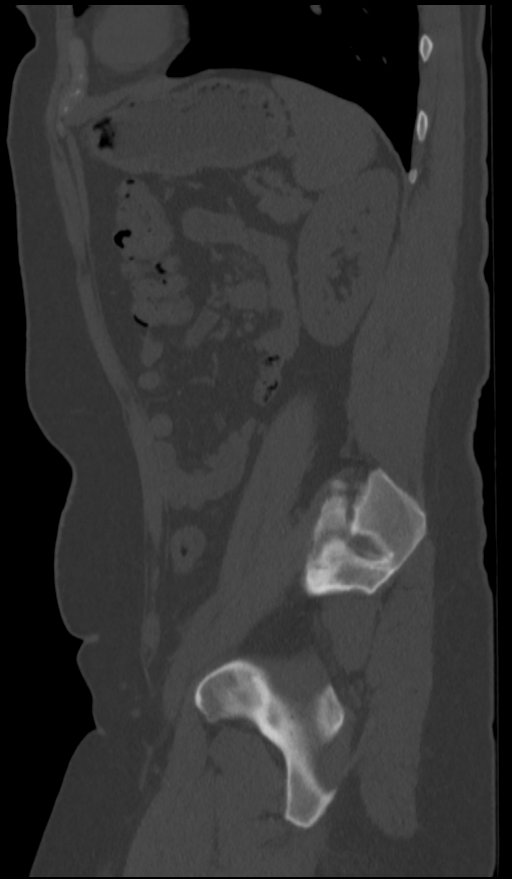

[8 of 46 positions shown; findings below may reference images not displayed]

FINDINGS: Lower chest: No acute abnormality.

Hepatobiliary: No focal liver abnormality is seen. No gallstones,
gallbladder wall thickening, or biliary dilatation.

Pancreas: Unremarkable. No pancreatic ductal dilatation or
surrounding inflammatory changes.

Spleen: Normal in size without focal abnormality.

Adrenals/Urinary Tract: Adrenal glands are unremarkable. Kidneys are
normal, without renal calculi, focal lesion, or hydronephrosis.
Bladder is unremarkable.

Stomach/Bowel: Stomach is within normal limits. Appendix appears
normal. No evidence of bowel wall thickening, distention, or
inflammatory changes.

Vascular/Lymphatic: No significant vascular findings are present. No
enlarged abdominal or pelvic lymph nodes.

Reproductive: An IUD is in place. A portion of its distal tip
appears to extend into the soft tissues anterior to the inferior
aspect of the uterine fundus (axial CT images 69 and 70, CT series
2). The uterus and bilateral adnexa are otherwise unremarkable.

Other: No abdominal wall hernia or abnormality. No abdominopelvic
ascites.

Musculoskeletal: No acute or significant osseous findings.
IMPRESSION: 1. No acute intra-abdominal findings.
2. IUD in place with a small portion of its distal tip extending
through the uterine fundus. Correlation with pelvic ultrasound is
recommended.

## 2022-05-27 ENCOUNTER — Emergency Department (HOSPITAL_COMMUNITY): Payer: BC Managed Care – PPO

## 2022-05-27 ENCOUNTER — Encounter (HOSPITAL_COMMUNITY): Payer: Self-pay | Admitting: *Deleted

## 2022-05-27 ENCOUNTER — Emergency Department (HOSPITAL_COMMUNITY)
Admission: EM | Admit: 2022-05-27 | Discharge: 2022-05-27 | Disposition: A | Discharge status: Home / Self Care | Payer: BC Managed Care – PPO | Attending: Emergency Medicine | Admitting: Emergency Medicine

## 2022-05-27 ENCOUNTER — Other Ambulatory Visit: Payer: Self-pay

## 2022-05-27 DIAGNOSIS — R1011 Right upper quadrant pain: Secondary | ICD-10-CM | POA: Diagnosis not present

## 2022-05-27 DIAGNOSIS — R1031 Right lower quadrant pain: Secondary | ICD-10-CM | POA: Diagnosis not present

## 2022-05-27 DIAGNOSIS — R109 Unspecified abdominal pain: Secondary | ICD-10-CM | POA: Diagnosis present

## 2022-05-27 DIAGNOSIS — Z79890 Hormone replacement therapy (postmenopausal): Secondary | ICD-10-CM | POA: Insufficient documentation

## 2022-05-27 DIAGNOSIS — E876 Hypokalemia: Secondary | ICD-10-CM | POA: Diagnosis not present

## 2022-05-27 DIAGNOSIS — E039 Hypothyroidism, unspecified: Secondary | ICD-10-CM | POA: Diagnosis not present

## 2022-05-27 LAB — CBC WITH DIFFERENTIAL/PLATELET
Abs Immature Granulocytes: 0.03 10*3/uL (ref 0.00–0.07)
Basophils Absolute: 0 10*3/uL (ref 0.0–0.1)
Basophils Relative: 0 %
Eosinophils Absolute: 0 10*3/uL (ref 0.0–0.5)
Eosinophils Relative: 1 %
HCT: 47 % — ABNORMAL HIGH (ref 36.0–46.0)
Hemoglobin: 16.3 g/dL — ABNORMAL HIGH (ref 12.0–15.0)
Immature Granulocytes: 1 %
Lymphocytes Relative: 9 %
Lymphs Abs: 0.6 10*3/uL — ABNORMAL LOW (ref 0.7–4.0)
MCH: 30.8 pg (ref 26.0–34.0)
MCHC: 34.7 g/dL (ref 30.0–36.0)
MCV: 88.7 fL (ref 80.0–100.0)
Monocytes Absolute: 0.4 10*3/uL (ref 0.1–1.0)
Monocytes Relative: 7 %
Neutro Abs: 5.5 10*3/uL (ref 1.7–7.7)
Neutrophils Relative %: 82 %
Platelets: 273 10*3/uL (ref 150–400)
RBC: 5.3 MIL/uL — ABNORMAL HIGH (ref 3.87–5.11)
RDW: 12.4 % (ref 11.5–15.5)
WBC: 6.6 10*3/uL (ref 4.0–10.5)
nRBC: 0 % (ref 0.0–0.2)

## 2022-05-27 LAB — URINALYSIS, W/ REFLEX TO CULTURE (INFECTION SUSPECTED)
Bilirubin Urine: NEGATIVE
Glucose, UA: NEGATIVE mg/dL
Ketones, ur: 20 mg/dL — AB
Leukocytes,Ua: NEGATIVE
Nitrite: NEGATIVE
Protein, ur: NEGATIVE mg/dL
Specific Gravity, Urine: 1.017 (ref 1.005–1.030)
pH: 5 (ref 5.0–8.0)

## 2022-05-27 LAB — COMPREHENSIVE METABOLIC PANEL
ALT: 13 U/L (ref 0–44)
AST: 18 U/L (ref 15–41)
Albumin: 5.2 g/dL — ABNORMAL HIGH (ref 3.5–5.0)
Alkaline Phosphatase: 80 U/L (ref 38–126)
Anion gap: 10 (ref 5–15)
BUN: 10 mg/dL (ref 6–20)
CO2: 23 mmol/L (ref 22–32)
Calcium: 9.4 mg/dL (ref 8.9–10.3)
Chloride: 103 mmol/L (ref 98–111)
Creatinine, Ser: 0.95 mg/dL (ref 0.44–1.00)
GFR, Estimated: >60 mL/min (ref >60)
Glucose, Bld: 94 mg/dL (ref 70–99)
Potassium: 3.3 mmol/L — ABNORMAL LOW (ref 3.5–5.1)
Sodium: 136 mmol/L (ref 135–145)
Total Bilirubin: 3.4 mg/dL — ABNORMAL HIGH (ref 0.3–1.2)
Total Protein: 8.4 g/dL — ABNORMAL HIGH (ref 6.5–8.1)

## 2022-05-27 LAB — LIPASE, BLOOD: Lipase: 38 U/L (ref 11–51)

## 2022-05-27 LAB — I-STAT BETA HCG BLOOD, ED (MC, WL, AP ONLY): I-stat hCG, quantitative: <5 m[IU]/mL (ref <5)

## 2022-05-27 LAB — D-DIMER, QUANTITATIVE: D-Dimer, Quant: 0.48 ug/mL-FEU (ref 0.00–0.50)

## 2022-05-27 MED ORDER — ONDANSETRON HCL 4 MG/2ML IJ SOLN
4.0000 mg | Freq: Once | INTRAMUSCULAR | Status: AC
  Administered 2022-05-27: 4 mg via INTRAVENOUS
  Filled 2022-05-27: qty 2

## 2022-05-27 MED ORDER — SODIUM CHLORIDE 0.9 % IV BOLUS
1000.0000 mL | Freq: Once | INTRAVENOUS | Status: AC
  Administered 2022-05-27: 1000 mL via INTRAVENOUS

## 2022-05-27 MED ORDER — ONDANSETRON 4 MG PO TBDP: 4.0000 mg | ORAL_TABLET | Freq: Three times a day (TID) | ORAL | 0 refills | Status: AC | PRN

## 2022-05-27 MED ORDER — POTASSIUM CHLORIDE CRYS ER 20 MEQ PO TBCR
40.0000 meq | EXTENDED_RELEASE_TABLET | Freq: Once | ORAL | Status: AC
  Administered 2022-05-27: 40 meq via ORAL
  Filled 2022-05-27: qty 2

## 2022-05-27 MED ORDER — KETOROLAC TROMETHAMINE 15 MG/ML IJ SOLN
15.0000 mg | Freq: Once | INTRAMUSCULAR | Status: AC
  Administered 2022-05-27: 15 mg via INTRAVENOUS
  Filled 2022-05-27: qty 1

## 2022-05-27 MED ORDER — IOHEXOL 300 MG/ML  SOLN
100.0000 mL | Freq: Once | INTRAMUSCULAR | Status: AC | PRN
  Administered 2022-05-27: 100 mL via INTRAVENOUS

## 2022-05-27 MED ORDER — HYDROMORPHONE HCL 1 MG/ML IJ SOLN
1.0000 mg | Freq: Once | INTRAMUSCULAR | Status: AC
  Administered 2022-05-27: 1 mg via INTRAVENOUS
  Filled 2022-05-27: qty 1

## 2022-05-27 MED ORDER — HYDROCODONE-ACETAMINOPHEN 5-325 MG PO TABS: 1.0000 | ORAL_TABLET | ORAL | 0 refills | Status: DC | PRN

## 2022-05-27 NOTE — ED Triage Notes (Signed)
Rt flank pain that goes in rt posterior shoulder area. Denies urinary symptoms, Nausea with pain.

## 2022-05-27 NOTE — Discharge Instructions (Addendum)
It's unclear what is causing your abdominal pain. You can take Ibuprofen, Tylenol, and you are being given hydrocodone for breakthrough pain.  You are being referred to a gastroenterologist and you should also see your primary care physician.  If at any point your pain worsens, does not improve, you develop fever, vomiting, or any other new/concerning symptoms then return to the ER or call 911.

## 2022-05-27 NOTE — ED Provider Notes (Signed)
[facility] Provider Note   CSN: [id number] Arrival date & time: 05/27/22  1123     History  Chief Complaint  Patient presents with   Flank Pain    Angela Clark is a 37 y.o. female.  HPI 37 year old female with history of endometriosis, hypothyroidism and celiac disease presents with right-sided back pain.  Symptoms started last night.  No clear inciting factor.  She also had chills and she thinks he might had a fever last night.  She had some progressive nausea and the pain has not gone away.  She has not take anything for the pain.  Does not seem to radiate though this morning she also noticed pain in her right shoulder.  She has not had chest pain, cough, shortness of breath or urinary symptoms.  No vomiting or diarrhea.  She has not noticed any abdominal pain.  Right now the pain is about a 9. No pleuritic pain. She states the last couple of time she has been to her doctor for bilirubin has been elevated in the 2 range.  Home Medications Prior to Admission medications   Medication Sig Start Date End Date Taking? Authorizing Provider  HYDROcodone-acetaminophen (NORCO) 5-325 MG tablet Take 1 tablet by mouth every 4 (four) hours as needed for severe pain. 05/27/22  Yes Sherwood Gambler, MD  ondansetron (ZOFRAN-ODT) 4 MG disintegrating tablet Take 1 tablet (4 mg total) by mouth every 8 (eight) hours as needed for nausea or vomiting. 05/27/22  Yes Sherwood Gambler, MD  albuterol (PROVENTIL HFA;VENTOLIN HFA) 108 (90 Base) MCG/ACT inhaler Inhale 1-2 puffs into the lungs every 6 (six) hours as needed for wheezing or shortness of breath. 05/19/18   Petrucelli, Samantha R, PA-C  amphetamine-dextroamphetamine (ADDERALL) 20 MG tablet TAKE 1 TABLET BY MOUTH EVERY MORNING AND 1/2 TABLET IN THE AFTERNOON AS NEEDED 05/14/20 11/10/20  Maryland Pink, MD  amphetamine-dextroamphetamine (ADDERALL) 20 MG tablet TAKE 1 TABLET BY MOUTH EVERY MORNING AND 1/2 TABLET IN THE  AFTERNOON AS NEEDED - EFFECTIVE 06/13/20 05/14/20 11/10/20  Maryland Pink, MD  amphetamine-dextroamphetamine (ADDERALL) 20 MG tablet TAKE 1 TABLET EVERY MORNING, AND 1/2 TABLET IN THE AFTERNOON IF NEEDED - EFFECTIVE 07/13/20 05/14/20 11/10/20  Maryland Pink, MD  amphetamine-dextroamphetamine (ADDERALL) 20 MG tablet Take 1 tablet by mouth every morning and 1/2 tablet in the afternoon as needed 11/11/20     amphetamine-dextroamphetamine (ADDERALL) 20 MG tablet Take 1 tablet by mouth every morning and 1/2 tablet in the afternoon as needed 12/11/20     amphetamine-dextroamphetamine (ADDERALL) 20 MG tablet Take 1 tablet by mouth every morning and 1/2 tablet in the afternoon if needed (01/10/21) 01/10/21     clindamycin-benzoyl peroxide (BENZACLIN) gel Apply topically 2 (two) times daily. 10/21/20   Perlie Mayo, NP  clindamycin-benzoyl peroxide [facility]) gel Apply topically 2 (two) times daily 01/28/21     ketorolac (TORADOL) 10 MG tablet Take 1 tablet (10 mg total) by mouth every 8 (eight) hours as needed for severe pain. 03/12/18   Nance Pear, MD  levothyroxine (SYNTHROID) 50 MCG tablet Alternate taking 1 tablet (66mg) by mouth and 1 and 1/2 tablets (744m) every other day. 11/11/20     linaclotide (LINZESS) 290 MCG CAPS capsule TAKE 1 CAPSULE BY MOUTH ONCE A DAY 05/28/20 05/28/21  HeMaryland PinkMD  lubiprostone (AMITIZA) 24 MCG capsule Take 1 capsule by mouth 2 times a day with a meal 07/21/20     pantoprazole (PROTONIX) 20 MG tablet Take  1 tablet (20 mg total) by mouth daily. 11/25/20 11/25/21  Merlyn Lot, MD  prochlorperazine (COMPAZINE) 10 MG tablet Take 1 tablet (10 mg total) by mouth every 8 (eight) hours as needed for nausea. 03/12/18   Nance Pear, MD  sodium chloride (OCEAN) 0.65 % SOLN nasal spray Place 1 spray into both nostrils as needed for congestion. 05/19/18   Petrucelli, Samantha R, PA-C      Allergies    Prednisone    Review of Systems   Review of Systems  Constitutional:   Positive for chills. Negative for fever.  Respiratory:  Negative for cough and shortness of breath.   Gastrointestinal:  Positive for nausea. Negative for abdominal pain, diarrhea and vomiting.  Genitourinary:  Negative for dysuria and menstrual problem.  Musculoskeletal:  Positive for back pain.    Physical Exam Updated Vital Signs BP 107/71   Pulse 78   Temp 98.2 F (36.8 C) (Oral)   Resp (!) 24   Ht '5\' 7"'$  (1.702 m)   Wt 77.1 kg   SpO2 99%   BMI 26.63 kg/m  Physical Exam Vitals and nursing note reviewed.  Constitutional:      Appearance: She is well-developed.  HENT:     Head: Normocephalic and atraumatic.  Cardiovascular:     Rate and Rhythm: Normal rate and regular rhythm.     Heart sounds: Normal heart sounds.  Pulmonary:     Effort: Pulmonary effort is normal.     Breath sounds: Normal breath sounds.  Abdominal:     Palpations: Abdomen is soft.     Tenderness: There is abdominal tenderness in the right upper quadrant and right lower quadrant. There is right CVA tenderness.     Comments: No rash  Skin:    General: Skin is warm and dry.  Neurological:     Mental Status: She is alert.     ED Results / Procedures / Treatments   Labs (all labs ordered are listed, but only abnormal results are displayed) Labs Reviewed  COMPREHENSIVE METABOLIC PANEL - Abnormal; Notable for the following components:      Result Value   Potassium 3.3 (*)    Total Protein 8.4 (*)    Albumin 5.2 (*)    Total Bilirubin 3.4 (*)    All other components within normal limits  CBC WITH DIFFERENTIAL/PLATELET - Abnormal; Notable for the following components:   RBC 5.30 (*)    Hemoglobin 16.3 (*)    HCT 47.0 (*)    Lymphs Abs 0.6 (*)    All other components within normal limits  URINALYSIS, W/ REFLEX TO CULTURE (INFECTION SUSPECTED) - Abnormal; Notable for the following components:   Hgb urine dipstick MODERATE (*)    Ketones, ur 20 (*)    Bacteria, UA RARE (*)    All other  components within normal limits  LIPASE, BLOOD  D-DIMER, QUANTITATIVE  I-STAT BETA HCG BLOOD, ED ([facility], [facility], [facility] ONLY)    EKG EKG Interpretation  Date/Time:  Friday May 27 2022 12:29:28 EST Ventricular Rate:  83 PR Interval:  165 QRS Duration: 89 QT Interval:  367 QTC Calculation: 432 R Axis:   82 Text Interpretation: Sinus rhythm no acute ST/T changes no significant change since 2022 Confirmed by Sherwood Gambler [phone]) on 05/27/2022 2:02:24 PM  Radiology CT ABDOMEN PELVIS W CONTRAST  Result Date: 05/27/2022 CLINICAL DATA:  Right lower quadrant abdominal pain. EXAM: CT ABDOMEN AND PELVIS WITH CONTRAST TECHNIQUE: Multidetector CT imaging of the abdomen and pelvis  was performed using the standard protocol following bolus administration of intravenous contrast. RADIATION DOSE REDUCTION: This exam was performed according to the departmental dose-optimization program which includes automated exposure control, adjustment of the mA and/or kV according to patient size and/or use of iterative reconstruction technique. CONTRAST:  159m OMNIPAQUE IOHEXOL 300 MG/ML  SOLN COMPARISON:  November 25, 2020. FINDINGS: Lower chest: No acute abnormality. Hepatobiliary: No focal liver abnormality is seen. No gallstones, gallbladder wall thickening, or biliary dilatation. Pancreas: Unremarkable. No pancreatic ductal dilatation or surrounding inflammatory changes. Spleen: Normal in size without focal abnormality. Adrenals/Urinary Tract: Adrenal glands are unremarkable. Kidneys are normal, without renal calculi, focal lesion, or hydronephrosis. Bladder is unremarkable. Stomach/Bowel: The stomach appears normal. There is no evidence of bowel obstruction or inflammation. Vascular/Lymphatic: No significant vascular findings are present. No enlarged abdominal or pelvic lymph nodes. Reproductive: Intrauterine device is noted. No adnexal abnormality is noted. Other: No abdominal wall hernia or abnormality. No abdominopelvic  ascites. Musculoskeletal: No acute or significant osseous findings. IMPRESSION: No acute abnormality seen in the abdomen or pelvis. Electronically Signed   By: JMarijo ConceptionM.D.   On: 05/27/2022 15:53   UKoreaAbdomen Complete  Result Date: 05/27/2022 CLINICAL DATA:  Right-sided abdominal pain. EXAM: ABDOMEN ULTRASOUND COMPLETE COMPARISON:  November 25, 2020. FINDINGS: Gallbladder: No gallstones or wall thickening visualized. No sonographic Murphy sign noted by sonographer. Common bile duct: Diameter: 4 mm which is within normal limits. Liver: No focal lesion identified. Within normal limits in parenchymal echogenicity. Portal vein is patent on color Doppler imaging with normal direction of blood flow towards the liver. IVC: No abnormality visualized. Pancreas: Visualized portion unremarkable. Spleen: Size and appearance within normal limits. Right Kidney: Length: 11.4 cm. Echogenicity within normal limits. No mass or hydronephrosis visualized. Left Kidney: Length: 8.7 cm. This kidney is not well visualized due to overlying bowel gas. Echogenicity within normal limits. No mass or hydronephrosis visualized. Abdominal aorta: No aneurysm visualized. Other findings: None. IMPRESSION: Left kidney is not well visualized due to overlying bowel gas and body habitus. No definite abnormality seen in the visualized portion of the abdomen. Electronically Signed   By: JMarijo ConceptionM.D.   On: 05/27/2022 13:07   DG Chest 2 View  Result Date: 05/27/2022 CLINICAL DATA:  right flank/RUQ pain EXAM: CHEST - 2 VIEW COMPARISON:  Chest x-ray for May 19, 2018. FINDINGS: The heart size and mediastinal contours are within normal limits. Both lungs are clear. No visible pleural effusions or pneumothorax. No acute osseous abnormality. IMPRESSION: No active cardiopulmonary disease. Electronically Signed   By: FMargaretha SheffieldM.D.   On: 05/27/2022 12:19    Procedures Procedures    Medications Ordered in ED Medications   potassium chloride SA (KLOR-CON M) CR tablet 40 mEq (has no administration in time range)  ketorolac (TORADOL) 15 MG/ML injection 15 mg (15 mg Intravenous Given 05/27/22 1233)  sodium chloride 0.9 % bolus 1,000 mL (0 mLs Intravenous Stopped 05/27/22 1551)  ondansetron (ZOFRAN) injection 4 mg (4 mg Intravenous Given 05/27/22 1550)  HYDROmorphone (DILAUDID) injection 1 mg (1 mg Intravenous Given 05/27/22 1550)  iohexol (OMNIPAQUE) 300 MG/ML solution 100 mL (100 mLs Intravenous Contrast Given 05/27/22 1534)    ED Course/ Medical Decision Making/ A&P                             Medical Decision Making Amount and/or Complexity of Data Reviewed External Data Reviewed: labs and  notes. Labs: ordered.    Details: Mild hypokalemia.  Bilirubin 3.4 is elevated compared to a week and a half ago though it has been fluctuating on and off for the last year or so.  Normal WBC Radiology: ordered and independent interpretation performed.    Details: No appendicitis or cholecystitis ECG/medicine tests: ordered and independent interpretation performed.    Details: No acute ischemia  Risk Prescription drug management.   It is unclear what is causing the patient's pain.  She does have the elevated bilirubin but no other LFT abnormalities.  Lab work is otherwise reassuring.  Given the atraumatic nature and the location of the back pain, D-dimer sent for otherwise low risk PE and this is negative.  Ultrasound and CT are both nonrevealing.  While it is possible this is still an underlying gallbladder issue I think this is a lot less likely.  She originally did not want a thing like a narcotic and was given Toradol which did help but now is having more pain so she was given Dilaudid.  I did discuss with Dr. Therisa Doyne, was reassured by the negative workup despite the bilirubin.  States she may have underlying Gilbert's but at this point there is not anything to do but outpatient GI follow-up.  Will discharge home with short  course of pain medicine, GI follow-up referral, and we will give return precautions and I discussed these with patient and mom.        Final Clinical Impression(s) / ED Diagnoses Final diagnoses:  Right sided abdominal pain    Rx / DC Orders ED Discharge Orders          Ordered    HYDROcodone-acetaminophen (NORCO) 5-325 MG tablet  Every 4 hours PRN        05/27/22 1632    ondansetron (ZOFRAN-ODT) 4 MG disintegrating tablet  Every 8 hours PRN        05/27/22 1632              Sherwood Gambler, MD 05/27/22 1643

## 2022-07-18 ENCOUNTER — Telehealth: Payer: BC Managed Care – PPO | Admitting: Physician Assistant

## 2022-07-18 DIAGNOSIS — L739 Follicular disorder, unspecified: Secondary | ICD-10-CM

## 2022-07-18 MED ORDER — DOXYCYCLINE HYCLATE 100 MG PO TABS: 100.0000 mg | ORAL_TABLET | Freq: Two times a day (BID) | ORAL | 0 refills | Status: AC

## 2022-07-18 NOTE — Progress Notes (Signed)
  E Visit for Folliculitis   We are sorry you are not feeling well.  Here is how we plan to help!  Based on what you have shared with me it looks like you have folliculitis.  Folliculitis refers to inflammation of the superficial or deep portio of the hair follicle.  It can be infectious or non-infectious. Various bacteria, fungi, viruses, and parasites can cause infectious folliculis  Based upon what you have shared with me it looks like you have a bacterial follicultits.  Folliculitis is inflammation of the hair follicles that can be caused by a superficial infection of the skin and is treated with an antibiotic. I have prescribed: and Doxycycline 100 mg twice per day for 7 days   HOME CARE: Apply a warm, moist washcloth or compress using a saltwater solution (1 teaspoon of table salt to 2 cups water) Apply over the counter antibiotic cream, gel or wash  Apply soothing lotions such as oatmeal lotion or over the counter hydrocortisone cream Clean the affected skin twice daily with antibacterial soap. Use clean washcloth and towel each time and do not share with anyone.  Wash these items and clothes that have touched the area with hot soapy water. Protect the skin. If possible avoid shaving.  If you must shave, try an electric razor.  When done, rinse skin with warm water and apply moisturizer.  GET HELP RIGHT AWAY IF: You have extensive skin involvement or the symptoms return after treatment Symptoms don't go away after treatment. Severe itching that persists. If you rash spreads or swells. If you rash begins to smell. If it blisters and opens or develops a yellow-brown crust. You develop a fever. You have a sore throat. You become short of breath.  MAKE SURE YOU:  Understand these instructions. Will watch your condition. Will get help right away if you are not doing well or get worse.  Thank you for choosing an e-visit.  Your e-visit answers were reviewed by a board certified  advanced clinical practitioner to complete your personal care plan. Depending upon the condition, your plan could have included both over the counter or prescription medications.  Please review your pharmacy choice. Make sure the pharmacy is open so you can pick up prescription now. If there is a problem, you may contact your provider through MyChart messaging and have the prescription routed to another pharmacy.  Your safety is important to us. If you have drug allergies check your prescription carefully.   For the next 24 hours you can use MyChart to ask questions about today's visit, request a non-urgent call back, or ask for a work or school excuse. You will get an email in the next two days asking about your experience. I hope that your e-visit has been valuable and will speed your recovery.  

## 2022-07-18 NOTE — Progress Notes (Signed)
I have spent 5 minutes in review of e-visit questionnaire, review and updating patient chart, medical decision making and response to patient.   Waymon Laser Cody Doryan Bahl, PA-C    

## 2023-02-22 ENCOUNTER — Telehealth: Payer: BC Managed Care – PPO

## 2023-04-04 ENCOUNTER — Other Ambulatory Visit (HOSPITAL_COMMUNITY): Payer: Self-pay

## 2023-10-01 ENCOUNTER — Other Ambulatory Visit: Payer: Self-pay

## 2023-10-01 ENCOUNTER — Emergency Department
Admission: EM | Admit: 2023-10-01 | Discharge: 2023-10-01 | Disposition: A | Discharge status: Home / Self Care | Attending: Emergency Medicine | Admitting: Emergency Medicine

## 2023-10-01 ENCOUNTER — Emergency Department

## 2023-10-01 DIAGNOSIS — M25562 Pain in left knee: Secondary | ICD-10-CM

## 2023-10-01 DIAGNOSIS — M25652 Stiffness of left hip, not elsewhere classified: Secondary | ICD-10-CM | POA: Diagnosis not present

## 2023-10-01 NOTE — ED Triage Notes (Signed)
 Pt comes with c/o left knee pain. Pt states this started last night. Pt states she stepped to the left and felt her knee pop. Pt states pain when trying to bear weight. Pt states she did take otc meds and ice with little relief.

## 2023-10-01 NOTE — ED Notes (Signed)
 See triage note  Presents with pain to left knee  States she went to step over something  Felt a pop to knee  Sates she did not fall  Having pain medial and lateral to knee  Increased pain with standing

## 2023-10-01 NOTE — Discharge Instructions (Addendum)
 You may follow-up with orthopedics.  Please return for any new, worsening, or change in symptoms or other concerns.  It was a pleasure caring for you today.

## 2023-10-01 NOTE — ED Provider Notes (Signed)
 [facility] Provider Note    Event Date/Time   First MD Initiated Contact with Patient 10/01/23 1011     (approximate)   History   Knee Pain   HPI  Angela Clark is a 38 y.o. female who presents today for evaluation of left knee pain since last night.  Patient reports that she stepped to the left and felt a knee pop.  She has pain when attempting to weight-bear.  She denies numbness or tingling.  She has been able to walk with a limp.  Patient Active Problem List   Diagnosis Date Noted   Gestational hypertension 06/18/2017   Gestational hypertension w/o significant proteinuria in 3rd trimester 06/13/2017   Sepsis (HCC) 08/18/2016   Pelvic pain 08/07/2016          Physical Exam   Triage Vital Signs: ED Triage Vitals  Encounter Vitals Group     BP 10/01/23 0958 115/71     Girls Systolic BP Percentile --      Girls Diastolic BP Percentile --      Boys Systolic BP Percentile --      Boys Diastolic BP Percentile --      Pulse Rate 10/01/23 0958 77     Resp 10/01/23 0958 18     Temp 10/01/23 0958 (!) 97.4 F (36.3 C)     Temp src --      SpO2 10/01/23 0958 99 %     Weight 10/01/23 0957 170 lb (77.1 kg)     Height 10/01/23 0957 5' 7 (1.702 m)     Head Circumference --      Peak Flow --      Pain Score 10/01/23 0957 9     Pain Loc --      Pain Education --      Exclude from Growth Chart --     Most recent vital signs: Vitals:   10/01/23 0958  BP: 115/71  Pulse: 77  Resp: 18  Temp: (!) 97.4 F (36.3 C)  SpO2: 99%    Physical Exam Vitals and nursing note reviewed.  Constitutional:      General: Awake and alert. No acute distress.    Appearance: Normal appearance. The patient is normal weight.  HENT:     Head: Normocephalic and atraumatic.     Mouth: Mucous membranes are moist.  Eyes:     General: PERRL. Normal EOMs        Right eye: No discharge.        Left eye: No discharge.     Conjunctiva/sclera: Conjunctivae normal.   Cardiovascular:     Rate and Rhythm: Normal rate and regular rhythm.     Pulses: Normal pulses.  Pulmonary:     Effort: Pulmonary effort is normal. No respiratory distress.     Breath sounds: Normal breath sounds.  Abdominal:     Abdomen is soft. There is no abdominal tenderness. No rebound or guarding. No distention. Musculoskeletal:        General: No swelling. Normal range of motion.     Cervical back: Normal range of motion and neck supple.  Left knee: No deformity or rash.  Medial joint line tenderness. No patellar tenderness, no ballotment Warm and well perfused extremity with 2+ pedal pulses 5/5 strength to dorsiflexion and plantarflexion at the ankle with intact sensation throughout extremity Normal range of motion of the knee, with intact flexion and extension to active and passive range of motion. Extensor mechanism  intact. No ligamentous laxity, though pain with varus and valgus stress.. Negative anterior/posterior drawer/negative lachman, negative mcmurrays No effusion or warmth Intact quadriceps, hamstring function, patellar tendon function Pelvis stable Full ROM of ankle without pain or swelling Foot warm and well perfused Skin:    General: Skin is warm and dry.     Capillary Refill: Capillary refill takes less than 2 seconds.     Findings: No rash.  Neurological:     Mental Status: The patient is awake and alert.      ED Results / Procedures / Treatments   Labs (all labs ordered are listed, but only abnormal results are displayed) Labs Reviewed - No data to display   EKG     RADIOLOGY I independently reviewed and interpreted imaging and agree with radiologists findings.     PROCEDURES:  Critical Care performed:   Procedures   MEDICATIONS ORDERED IN ED: Medications - No data to display   IMPRESSION / MDM / ASSESSMENT AND PLAN / ED COURSE  I reviewed the triage vital signs and the nursing notes.   Differential diagnosis includes, but is  not limited to, effusion, sprain, contusion, dislocation, fracture, joint infection, tendon rupture.   Patient is awake and alert, hemodynamically stable and afebrile.  No evidence of neurological deficit or vascular compromise on exam, with normal pedal pulses bilaterally, sensation intact light touch and equal to opposite throughout her lower extremity, normal capillary refill.  No fracture/dislocation on X-Ray. No deformity or obvious ligamentous laxity on exam.she has pain with varus and valgus stress, though no laxity appreciated, and no effusion on x-ray to suggest ligamental rupture.  Overall well appearing, vital signs stable. No indication for diagnostic or therapeutic procedure such as arthrocentesis.  She was given a knee brace for extra support.  She was also given the appropriate follow-up information for orthopedics.  We discussed return precautions. Patient agrees with plan of care.    Patient's presentation is most consistent with acute complicated illness / injury requiring diagnostic workup.    FINAL CLINICAL IMPRESSION(S) / ED DIAGNOSES   Final diagnoses:  Acute pain of left knee     Rx / DC Orders   ED Discharge Orders     None        Note:  This document was prepared using Dragon voice recognition software and may include unintentional dictation errors.   Kaetlyn Noa E, PA-C 10/01/23 1230    Suzanne Kirsch, MD 10/01/23 1635

## 2023-10-02 DIAGNOSIS — S838X2A Sprain of other specified parts of left knee, initial encounter: Secondary | ICD-10-CM | POA: Diagnosis not present

## 2023-10-03 DIAGNOSIS — M2392 Unspecified internal derangement of left knee: Secondary | ICD-10-CM | POA: Diagnosis not present

## 2023-10-03 DIAGNOSIS — M25562 Pain in left knee: Secondary | ICD-10-CM | POA: Diagnosis not present

## 2023-10-10 DIAGNOSIS — M25562 Pain in left knee: Secondary | ICD-10-CM | POA: Diagnosis not present

## 2023-10-13 DIAGNOSIS — S83511A Sprain of anterior cruciate ligament of right knee, initial encounter: Secondary | ICD-10-CM | POA: Diagnosis not present

## 2023-10-13 DIAGNOSIS — S83412A Sprain of medial collateral ligament of left knee, initial encounter: Secondary | ICD-10-CM | POA: Diagnosis not present

## 2023-10-13 DIAGNOSIS — M23672 Other spontaneous disruption of capsular ligament of left knee: Secondary | ICD-10-CM | POA: Diagnosis not present

## 2023-10-13 DIAGNOSIS — S83512A Sprain of anterior cruciate ligament of left knee, initial encounter: Secondary | ICD-10-CM | POA: Diagnosis not present

## 2023-10-13 DIAGNOSIS — S83242A Other tear of medial meniscus, current injury, left knee, initial encounter: Secondary | ICD-10-CM | POA: Diagnosis not present

## 2023-10-19 DIAGNOSIS — M25562 Pain in left knee: Secondary | ICD-10-CM | POA: Diagnosis not present

## 2023-10-24 DIAGNOSIS — M25562 Pain in left knee: Secondary | ICD-10-CM | POA: Diagnosis not present

## 2023-10-26 DIAGNOSIS — M25562 Pain in left knee: Secondary | ICD-10-CM | POA: Diagnosis not present

## 2023-10-31 DIAGNOSIS — M25562 Pain in left knee: Secondary | ICD-10-CM | POA: Diagnosis not present

## 2023-11-01 DIAGNOSIS — S83512D Sprain of anterior cruciate ligament of left knee, subsequent encounter: Secondary | ICD-10-CM | POA: Diagnosis not present

## 2023-11-01 DIAGNOSIS — S83222D Peripheral tear of medial meniscus, current injury, left knee, subsequent encounter: Secondary | ICD-10-CM | POA: Diagnosis not present

## 2023-11-08 DIAGNOSIS — S83512D Sprain of anterior cruciate ligament of left knee, subsequent encounter: Secondary | ICD-10-CM | POA: Diagnosis not present

## 2023-11-08 DIAGNOSIS — S83207D Unspecified tear of unspecified meniscus, current injury, left knee, subsequent encounter: Secondary | ICD-10-CM | POA: Diagnosis not present

## 2023-11-08 DIAGNOSIS — S83512A Sprain of anterior cruciate ligament of left knee, initial encounter: Secondary | ICD-10-CM | POA: Diagnosis not present

## 2023-11-08 DIAGNOSIS — Z01818 Encounter for other preprocedural examination: Secondary | ICD-10-CM | POA: Diagnosis not present

## 2023-11-20 DIAGNOSIS — S83242A Other tear of medial meniscus, current injury, left knee, initial encounter: Secondary | ICD-10-CM | POA: Diagnosis not present

## 2023-11-20 DIAGNOSIS — S83512A Sprain of anterior cruciate ligament of left knee, initial encounter: Secondary | ICD-10-CM | POA: Diagnosis not present

## 2023-11-20 DIAGNOSIS — G8918 Other acute postprocedural pain: Secondary | ICD-10-CM | POA: Diagnosis not present

## 2023-11-20 HISTORY — PX: ARTHROSCOPIC REPAIR ACL: SUR80

## 2023-11-28 ENCOUNTER — Other Ambulatory Visit (HOSPITAL_COMMUNITY): Payer: Self-pay

## 2023-11-28 MED ORDER — LINACLOTIDE 145 MCG PO CAPS
145.0000 ug | ORAL_CAPSULE | Freq: Every day | ORAL | 3 refills | Status: AC
  Filled 2023-11-28: qty 90, 90d supply, fill #0

## 2023-11-30 ENCOUNTER — Other Ambulatory Visit: Payer: Self-pay | Admitting: Orthopedic Surgery

## 2023-11-30 ENCOUNTER — Ambulatory Visit
Admission: RE | Admit: 2023-11-30 | Discharge: 2023-11-30 | Disposition: A | Discharge status: Home / Self Care | Source: Ambulatory Visit | Attending: Orthopedic Surgery | Admitting: Orthopedic Surgery

## 2023-11-30 DIAGNOSIS — I82452 Acute embolism and thrombosis of left peroneal vein: Secondary | ICD-10-CM | POA: Diagnosis not present

## 2023-11-30 DIAGNOSIS — M7989 Other specified soft tissue disorders: Secondary | ICD-10-CM | POA: Diagnosis not present

## 2023-11-30 DIAGNOSIS — M25562 Pain in left knee: Secondary | ICD-10-CM | POA: Diagnosis not present

## 2023-11-30 DIAGNOSIS — R2242 Localized swelling, mass and lump, left lower limb: Secondary | ICD-10-CM

## 2023-12-04 DIAGNOSIS — M25562 Pain in left knee: Secondary | ICD-10-CM | POA: Diagnosis not present

## 2023-12-08 DIAGNOSIS — M25562 Pain in left knee: Secondary | ICD-10-CM | POA: Diagnosis not present

## 2023-12-11 ENCOUNTER — Other Ambulatory Visit (HOSPITAL_COMMUNITY): Payer: Self-pay

## 2023-12-13 DIAGNOSIS — M25562 Pain in left knee: Secondary | ICD-10-CM | POA: Diagnosis not present

## 2023-12-15 DIAGNOSIS — M25562 Pain in left knee: Secondary | ICD-10-CM | POA: Diagnosis not present

## 2023-12-20 DIAGNOSIS — M25562 Pain in left knee: Secondary | ICD-10-CM | POA: Diagnosis not present

## 2023-12-22 DIAGNOSIS — M25562 Pain in left knee: Secondary | ICD-10-CM | POA: Diagnosis not present

## 2023-12-25 DIAGNOSIS — M25562 Pain in left knee: Secondary | ICD-10-CM | POA: Diagnosis not present

## 2023-12-27 DIAGNOSIS — M25562 Pain in left knee: Secondary | ICD-10-CM | POA: Diagnosis not present

## 2023-12-28 DIAGNOSIS — S83512D Sprain of anterior cruciate ligament of left knee, subsequent encounter: Secondary | ICD-10-CM | POA: Diagnosis not present

## 2024-01-02 DIAGNOSIS — M25562 Pain in left knee: Secondary | ICD-10-CM | POA: Diagnosis not present

## 2024-01-04 DIAGNOSIS — M25562 Pain in left knee: Secondary | ICD-10-CM | POA: Diagnosis not present

## 2024-01-09 DIAGNOSIS — M25562 Pain in left knee: Secondary | ICD-10-CM | POA: Diagnosis not present

## 2024-01-11 DIAGNOSIS — M25562 Pain in left knee: Secondary | ICD-10-CM | POA: Diagnosis not present

## 2024-01-16 ENCOUNTER — Encounter (INDEPENDENT_AMBULATORY_CARE_PROVIDER_SITE_OTHER): Payer: Self-pay | Admitting: Vascular Surgery

## 2024-01-16 ENCOUNTER — Ambulatory Visit (INDEPENDENT_AMBULATORY_CARE_PROVIDER_SITE_OTHER): Admitting: Vascular Surgery

## 2024-01-16 VITALS — BP 131/83 | HR 91 | Resp 18 | Ht 67.0 in | Wt 183.6 lb

## 2024-01-16 DIAGNOSIS — I82409 Acute embolism and thrombosis of unspecified deep veins of unspecified lower extremity: Secondary | ICD-10-CM | POA: Insufficient documentation

## 2024-01-16 DIAGNOSIS — I82452 Acute embolism and thrombosis of left peroneal vein: Secondary | ICD-10-CM

## 2024-01-16 DIAGNOSIS — M25562 Pain in left knee: Secondary | ICD-10-CM | POA: Diagnosis not present

## 2024-01-16 NOTE — Assessment & Plan Note (Signed)
 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  Her symptoms have significantly improved.  This was clearly a provoked DVT around the time of a major orthopedic surgery and I would have a low suspicion for a hypercoagulable state.  At this point, I think it is okay to stop the Eliquis and go to an 81 mg aspirin daily for at least 6 months.  I discussed that increasing her activity and exercise tolerance would be perfectly acceptable from my point of view.  No further intervention or evaluation is planned.  I will see her back as needed.

## 2024-01-16 NOTE — Progress Notes (Signed)
 Patient ID: Angela Clark, female   DOB: 03-17-1986, 38 y.o.   MRN: [id number]  Chief Complaint  Patient presents with   New Patient (Initial Visit)    Localized swelling, mass and lump, left lower limb. pt had post op DVT. krasinski, kevin.  Per pt 8weeks post op had been on eliquis and is here to follow up to see if she should continue     HPI DELPHINE Clark is a 38 y.o. female.  I am asked to see the patient by Dr. Krasinski for evaluation of left peroneal DVT following left knee surgery.  She noticed marked redness, pain, and swelling in the left calf.  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  The swelling, pain, and redness have all improved and essentially normalized since that time.  She still has numbness and some pain around her knee and localized swelling around the knee but overall this would be expected after her major knee surgery 6 or 7 weeks ago.  She has finished her Eliquis at this point.     Past Medical History:  Diagnosis Date   Endometriosis    Thyroid  disease     Past Surgical History:  Procedure Laterality Date   ARTHROSCOPIC REPAIR ACL Left 11/20/2023   LAPAROSCOPIC BILATERAL SALPINGO OOPHERECTOMY Left 08/07/2016   Procedure: LAPAROSCOPIC LEFT  SALPINGO OOPHORECTOMY;  Surgeon: Ward, Mitzie BROCKS, MD;  Location: [facility];  Service: Gynecology;  Laterality: Left;   LAPAROSCOPY N/A 08/07/2016   Procedure: LAPAROSCOPY OPERATIVE;  Surgeon: Ward, Mitzie BROCKS, MD;  Location: [facility];  Service: Gynecology;  Laterality: N/A;   TONSILLECTOMY       No family history on file.    Social History   Tobacco Use   Smoking status: Never   Smokeless tobacco: Never  Substance Use Topics   Alcohol use: Yes    Comment: Occasionally   Drug use: No     Allergies  Allergen Reactions   Prednisone Rash    Caused a rash 15 years ago    Current Outpatient Medications  Medication Sig Dispense Refill    amphetamine -dextroamphetamine  (ADDERALL) 20 MG tablet TAKE 1 TABLET BY MOUTH EVERY MORNING AND 1/2 TABLET IN THE AFTERNOON AS NEEDED 45 tablet 0   amphetamine -dextroamphetamine  (ADDERALL) 20 MG tablet TAKE 1 TABLET BY MOUTH EVERY MORNING AND 1/2 TABLET IN THE AFTERNOON AS NEEDED - EFFECTIVE 06/13/20 45 tablet 0   amphetamine -dextroamphetamine  (ADDERALL) 20 MG tablet TAKE 1 TABLET EVERY MORNING, AND 1/2 TABLET IN THE AFTERNOON IF NEEDED - EFFECTIVE 07/13/20 45 tablet 0   amphetamine -dextroamphetamine  (ADDERALL) 20 MG tablet Take 1 tablet by mouth every morning and 1/2 tablet in the afternoon as needed 45 tablet 0   amphetamine -dextroamphetamine  (ADDERALL) 20 MG tablet Take 1 tablet by mouth every morning and 1/2 tablet in the afternoon as needed 45 tablet 0   amphetamine -dextroamphetamine  (ADDERALL) 20 MG tablet Take 1 tablet by mouth every morning and 1/2 tablet in the afternoon if needed (01/10/21) 45 tablet 0   clindamycin -benzoyl peroxide  (BENZACLIN) gel Apply topically 2 (two) times daily. 25 g 0   clindamycin -benzoyl peroxide  (BENZACLIN) gel Apply topically 2 (two) times daily 25 g 5   levothyroxine  (SYNTHROID ) 50 MCG tablet Alternate taking 1 tablet (50mcg) by mouth and 1 and 1/2 tablets (75mcg) every other day. 135 tablet 3   linaclotide  (LINZESS ) 145 MCG CAPS capsule Take 1 capsule (145 mcg total) by mouth  daily. 90 capsule 3   linaclotide  (LINZESS ) 290 MCG CAPS capsule TAKE 1 CAPSULE BY MOUTH ONCE A DAY 30 capsule 11   albuterol  (PROVENTIL  HFA;VENTOLIN  HFA) 108 (90 Base) MCG/ACT inhaler Inhale 1-2 puffs into the lungs every 6 (six) hours as needed for wheezing or shortness of breath. (Patient not taking: Reported on 01/16/2024) 1 Inhaler 0   doxycycline  (VIBRA -TABS) 100 MG tablet Take 1 tablet (100 mg total) by mouth 2 (two) times daily. (Patient not taking: Reported on 01/16/2024) 14 tablet 0   lubiprostone  (AMITIZA ) 24 MCG capsule Take 1 capsule by mouth 2 times a day with a meal (Patient not  taking: Reported on 01/16/2024) 60 capsule 11   ondansetron  (ZOFRAN -ODT) 4 MG disintegrating tablet Take 1 tablet (4 mg total) by mouth every 8 (eight) hours as needed for nausea or vomiting. (Patient not taking: Reported on 01/16/2024) 10 tablet 0   pantoprazole  (PROTONIX ) 20 MG tablet Take 1 tablet (20 mg total) by mouth daily. (Patient not taking: Reported on 01/16/2024) 30 tablet 0   prochlorperazine  (COMPAZINE ) 10 MG tablet Take 1 tablet (10 mg total) by mouth every 8 (eight) hours as needed for nausea. (Patient not taking: Reported on 01/16/2024) 20 tablet 0   sodium chloride  (OCEAN) 0.65 % SOLN nasal spray Place 1 spray into both nostrils as needed for congestion. (Patient not taking: Reported on 01/16/2024) 15 mL 0   No current facility-administered medications for this visit.      REVIEW OF SYSTEMS (Negative unless checked)  Constitutional: [] Weight loss  [] Fever  [] Chills Cardiac: [] Chest pain   [] Chest pressure   [] Palpitations   [] Shortness of breath when laying flat   [] Shortness of breath at rest   [] Shortness of breath with exertion. Vascular:  [] Pain in legs with walking   [] Pain in legs at rest   [] Pain in legs when laying flat   [] Claudication   [] Pain in feet when walking  [] Pain in feet at rest  [] Pain in feet when laying flat   [x] History of DVT   [x] Phlebitis   [x] Swelling in legs   [] Varicose veins   [] Non-healing ulcers Pulmonary:   [] Uses home oxygen   [] Productive cough   [] Hemoptysis   [] Wheeze  [] COPD   [] Asthma Neurologic:  [] Dizziness  [] Blackouts   [] Seizures   [] History of stroke   [] History of TIA  [] Aphasia   [] Temporary blindness   [] Dysphagia   [] Weakness or numbness in arms   [] Weakness or numbness in legs Musculoskeletal:  [x] Arthritis   [] Joint swelling   [x] Joint pain   [] Low back pain Hematologic:  [] Easy bruising  [] Easy bleeding   [] Hypercoagulable state   [] Anemic  [] Hepatitis Gastrointestinal:  [] Blood in stool   [] Vomiting blood  [] Gastroesophageal  reflux/heartburn   [] Abdominal pain Genitourinary:  [] Chronic kidney disease   [] Difficult urination  [] Frequent urination  [] Burning with urination   [] Hematuria Skin:  [] Rashes   [] Ulcers   [] Wounds Psychological:  [] History of anxiety   []  History of major depression.    Physical Exam BP 131/83   Pulse 91   Resp 18   Ht 5' 7 (1.702 m)   Wt 183 lb 9.6 oz (83.3 kg)   BMI 28.76 kg/m  Gen:  WD/WN, NAD Head: [place]/AT, No temporalis wasting.  Ear/Nose/Throat: Hearing grossly intact, nares w/o erythema or drainage, oropharynx w/o Erythema/Exudate Eyes: Conjunctiva clear, sclera non-icteric  Neck: trachea midline.  No JVD.  Pulmonary:  Good air movement, respirations not labored, no use of accessory  muscles  Cardiac: RRR, no JVD Vascular:  Vessel Right Left  Radial Palpable Palpable                                   Gastrointestinal:. No masses, surgical incisions, or scars. Musculoskeletal: M/S 5/5 throughout.  Extremities without ischemic changes.  No deformity or atrophy.  Surgical incisions are now well-healed.  1+ left lower extremity edema. Neurologic: Sensation grossly intact in extremities.  Symmetrical.  Speech is fluent. Motor exam as listed above. Psychiatric: Judgment intact, Mood & affect appropriate for pt's clinical situation. Dermatologic: No rashes or ulcers noted.  No cellulitis or open wounds.    Radiology No results found.  Labs No results found for this or any previous visit (from the past 2160 hours).  Assessment/Plan:  DVT (deep venous thrombosis) (HCC) The patient received an appropriate 6-week course of Eliquis for symptomatic tibial vein DVT.  Her symptoms have significantly improved.  This was clearly a provoked DVT around the time of a major orthopedic surgery and I would have a low suspicion for a hypercoagulable state.  At this point, I think it is okay to stop the Eliquis and go to an 81 mg aspirin daily for at least 6 months.  I discussed  that increasing her activity and exercise tolerance would be perfectly acceptable from my point of view.  No further intervention or evaluation is planned.  I will see her back as needed.      Selinda Gu 01/16/2024, 10:41 AM   This note was created with Dragon medical transcription system.  Any errors from dictation are unintentional.

## 2024-01-18 DIAGNOSIS — M25562 Pain in left knee: Secondary | ICD-10-CM | POA: Diagnosis not present

## 2024-01-24 DIAGNOSIS — M25562 Pain in left knee: Secondary | ICD-10-CM | POA: Diagnosis not present

## 2024-01-29 DIAGNOSIS — M25562 Pain in left knee: Secondary | ICD-10-CM | POA: Diagnosis not present

## 2024-02-01 DIAGNOSIS — M25562 Pain in left knee: Secondary | ICD-10-CM | POA: Diagnosis not present

## 2024-02-05 DIAGNOSIS — M25562 Pain in left knee: Secondary | ICD-10-CM | POA: Diagnosis not present

## 2024-02-08 DIAGNOSIS — M25562 Pain in left knee: Secondary | ICD-10-CM | POA: Diagnosis not present

## 2024-02-29 DIAGNOSIS — M25562 Pain in left knee: Secondary | ICD-10-CM | POA: Diagnosis not present

## 2024-03-12 DIAGNOSIS — M25562 Pain in left knee: Secondary | ICD-10-CM | POA: Diagnosis not present

## 2024-03-19 DIAGNOSIS — M25562 Pain in left knee: Secondary | ICD-10-CM | POA: Diagnosis not present

## 2024-03-26 DIAGNOSIS — M25562 Pain in left knee: Secondary | ICD-10-CM | POA: Diagnosis not present
